# Patient Record
Sex: Male | Born: 1960
Health system: Southern US, Community
[De-identification: ages and names within clinical notes are randomized; demographics above are authoritative.]

## PROBLEM LIST (undated history)

## (undated) ENCOUNTER — Emergency Department (HOSPITAL_COMMUNITY): Payer: BLUE CROSS/BLUE SHIELD

## (undated) DIAGNOSIS — Z6841 Body Mass Index (BMI) 40.0 and over, adult: Secondary | ICD-10-CM

## (undated) DIAGNOSIS — Z8739 Personal history of other diseases of the musculoskeletal system and connective tissue: Secondary | ICD-10-CM

## (undated) DIAGNOSIS — G4733 Obstructive sleep apnea (adult) (pediatric): Secondary | ICD-10-CM

## (undated) DIAGNOSIS — I428 Other cardiomyopathies: Secondary | ICD-10-CM

## (undated) DIAGNOSIS — I10 Essential (primary) hypertension: Secondary | ICD-10-CM

## (undated) DIAGNOSIS — Z7901 Long term (current) use of anticoagulants: Secondary | ICD-10-CM

## (undated) DIAGNOSIS — I4821 Permanent atrial fibrillation: Secondary | ICD-10-CM

## (undated) DIAGNOSIS — I509 Heart failure, unspecified: Secondary | ICD-10-CM

## (undated) DIAGNOSIS — I499 Cardiac arrhythmia, unspecified: Secondary | ICD-10-CM

## (undated) HISTORY — DX: Obstructive sleep apnea (adult) (pediatric): G47.33

## (undated) HISTORY — DX: Morbid (severe) obesity due to excess calories: E66.01

## (undated) HISTORY — DX: Other cardiomyopathies: I42.8

## (undated) HISTORY — DX: Body Mass Index (BMI) 40.0 and over, adult: Z684

## (undated) HISTORY — DX: Long term (current) use of anticoagulants: Z79.01

## (undated) HISTORY — DX: Permanent atrial fibrillation: I48.21

---

## 2003-03-03 ENCOUNTER — Emergency Department (HOSPITAL_COMMUNITY): Admission: EM | Admit: 2003-03-03 | Discharge: 2003-03-03 | Payer: Self-pay | Admitting: Emergency Medicine

## 2003-03-03 ENCOUNTER — Encounter: Payer: Self-pay | Admitting: Emergency Medicine

## 2003-03-28 ENCOUNTER — Encounter: Admission: RE | Admit: 2003-03-28 | Discharge: 2003-06-26 | Payer: Self-pay | Admitting: Internal Medicine

## 2003-10-05 ENCOUNTER — Encounter: Admission: RE | Admit: 2003-10-05 | Discharge: 2004-01-03 | Payer: Self-pay | Admitting: Internal Medicine

## 2007-12-17 HISTORY — PX: CARDIOVERSION: SHX1299

## 2008-01-27 ENCOUNTER — Inpatient Hospital Stay (HOSPITAL_COMMUNITY): Admission: EM | Admit: 2008-01-27 | Discharge: 2008-02-06 | Payer: Self-pay | Admitting: Emergency Medicine

## 2008-01-31 HISTORY — PX: CARDIAC CATHETERIZATION: SHX172

## 2008-04-27 ENCOUNTER — Ambulatory Visit (HOSPITAL_COMMUNITY): Admission: RE | Admit: 2008-04-27 | Discharge: 2008-04-27 | Payer: Self-pay | Admitting: Cardiology

## 2010-12-31 NOTE — Assessment & Plan Note (Signed)
[facility]   NAME:  Travis Bautista, Travis Bautista NO.:  [id number]   MEDICAL RECORD NO.:  [id number]      DATE OF BIRTH:  November 20, 1960   PHYSICIAN:  Ritta Slot, MD           VISIT DATE:                                   OFFICE VISIT   INDICATIONS FOR THE PROCEDURE:  Atrial flutter with active ventricular  response with possible DC cardioversion.   COMPLICATIONS:  None immediate.   SEDATION:  The patient was sedated with fentanyl 75 mcg IV and Versed 5  mg IV and intubated without difficulty.   RESULTS:  1. Left atrial appendage was large in size with decreased ejection      velocity of approximately 20 cm/sec.  No clots or thrombus were      noted.  2. The left atrium was severely dilated with significant smoke.  No      definitive causes were noted.  3. Right atrium was moderate-to-severely dilated.  4. Left ventricle was mildly dilated with severe left ventricular      dysfunction with EF of less than 20% and severe global hypokinesis.      No obvious thrombus was noted.  5. RV was normal in size with severe right ventricular dysfunction.  6. Mitral valve was structurally normal.  7. Tricuspid valve was structurally normal with at least mild      tricuspid regurgitation.  I was unable to obtain adequate Doppler      signal to estimate pulmonary pressures.  8. Aortic valve was structurally normal.  9. Pulmonic valve was normally seen.  10.Interatrial septum was intact by color flow Doppler.  11.Aorta was normal in size without significant atheroma noted.   We will not pursue DC cardioversion due to significant smoke noted in  the left atrium.  I would recommend Coumadin for 3-4 weeks with the  therapeutic INR followed by elective DC cardioversion.   He will need optimization of his medication for his severe left  ventricular dysfunction and probably right and left heart  catheterization at some point as well.     ______________________________  Dani Gobble, MD      Ritta Slot, MD  Electronically Signed    AB/MEDQ  D:  01/28/2008  T:  01/29/2008  Job:  [id number]   cc:   Ritta Slot, MD

## 2010-12-31 NOTE — Cardiovascular Report (Signed)
NAME:  Travis Bautista, Travis Bautista NO.:  [id number]   MEDICAL RECORD NO.:  [id number]         PATIENT TYPE:  COIB   LOCATION:                               FACILITY:  [facility]   PHYSICIAN:  Richard A. Alanda Amass, M.D.DATE OF BIRTH:  15-Aug-1961   DATE OF PROCEDURE:  DATE OF DISCHARGE:                            CARDIAC CATHETERIZATION   DC CARDIOVERSION   PROCEDURE:  Attempted DC cardioversion done at [facility] with IV anesthesia  standby with Dr. Gypsy Balsam.   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   The patient was hospitalized on January 27, 2008, with atrial flutter with  rapid ventricular response that required rate control including IV  amiodarone for rate control and calcium blockers.  Duration of his  atrial fibrillation was unknown.  He had developed heart failure and it  was felt that it was likely he had rate related congestive heart failure  and had been on atrial flutter - fibrillation for some time.   He has severe morbid obesity, sleep apnea, nonsmoker, and his weight was  over 400 pounds when hospitalized in June 2009.   Cardiac catheterization was uncomplicated and showed normal coronary  arteries with EF of 20-25% in AF.   He has been treated medically and kept on Coumadin, followed up as an  outpatient and treated with rate control.  He is classified as  nonischemic cardiomyopathy, but we think that there is a good  possibility that it might be rate related cardiomyopathy due to his  atrial fibrillation although we cannot rule out a primary congestive  nonischemic cardiomyopathy.   TEE-guided cardioversion was considered in the hospital but he did have  some smoke in the left atrium, so that was not performed.  He has been  seen as an outpatient.  His INRs have been therapeutic.  He has done  well on medical  therapy with essentially no symptoms of heart failure,  and he has lost almost 50 pounds since June 2009 with the diet, salt  restriction, and walking program.   CURRENT OUTPATIENT MEDICATIONS:  1. Coreg 12.5 b.i.d.  2. Altace 5.  3. Baby aspirin.  4. Lanoxin 0.25.  5. Aldactone 12.5.  6. Lasix 40 b.i.d.  7. Diltiazem 300.  8. Amiodarone 200 b.i.d.   Since he has been on outpatient amiodarone and had persistent atrial  fibrillation with controlled ventricular response, it was felt that it  was appropriate to bring him in for attempted DC cardioversion.  We had  held off on repeating his echo until cardioversion could be  accomplished.   Current weight is 396 pounds.  He is 6 feet.  We were concerned that  even with bipolar synchronized DC cardioversion that his weight and  chest impedance might inhibit cardioversion.  Procedure risks,  alternatives, and benefits were explained to the patient, he was  agreeable to proceed.  Informed consent was obtained.   INRs were therapeutic.  Renal function normal.  Mild elevation of  nonfasting glucose to 128.  Normal magnesium level.  The patient was in  the postobstructive state.   He was given a total of 600 mg of Pentothal anesthesia by Dr. Gypsy Balsam in  divided doses.   With anterior and posterior paddles and bipolar cautery attempts, a  synchronized cardioversion shock of 120 joules was unsuccessful and 2  shocks of 200 joules were unsuccessful in restoring sinus rhythm.   The pads were then changed to 2 anterior pads and 2 more shocks of 200  joules biphasic were unsuccessful in restoring sinus rhythm.  The  patient remained in atrial fibrillation with a controlled ventricular  response of plus/minus 80 per minute.   We will plan to continue his current medications including Coumadin.  The patient tolerated the procedure well, awoke promptly postanesthesia  with no neurologic deficits.   We will get EP consultation for  consideration of further attempts of  cardioversion and possibly internal cardioversion may be considered  because of his large body mass and impedance, particularly that since he  is on antiarrhythmic therapy.  We will go ahead and obtain an outpatient  2D echo to assess LV function and repeat left atrial size as well.  We  will plan to see the patient back in the office in the next several  weeks or sooner if there are any questions or problems.      Richard A. Alanda Amass, M.D.  Electronically Signed     RAW/MEDQ  D:  04/27/2008  T:  04/28/2008  Job:  [id number]   cc:   Larina Earthly, M.D.

## 2010-12-31 NOTE — Cardiovascular Report (Signed)
NAME:  Travis Bautista, Travis Bautista NO.:  [id number]   MEDICAL RECORD NO.:  [id number]          PATIENT TYPE:  INP   LOCATION:  2905                         FACILITY:  [facility]   PHYSICIAN:  Richard A. Alanda Amass, M.D.DATE OF BIRTH:  [birth year]   DATE OF PROCEDURE:  01/31/2008  DATE OF DISCHARGE:                            CARDIAC CATHETERIZATION   PROCEDURE:  Retrograde central aortic catheterization, retrograde left  heart catheterization, right heart catheterization, thermal dilution  cardiac output, simultaneous A-PO2 difference, selective coronary  angiography via Judkins technique, LV angiogram RAO, LAO projection, and  abdominal aortic angiogram hand injection.   PROCEDURE:  The patient was brought to the second floor CP lab in a  postabsorptive state after premedication of 5-mg Valium p.o.  Heparin  was on hold.  He was in atrial fibrillation-atrial flutter with  ventricular response of 90-100 per minute on IV amiodarone therapy.  The  right groin was prepped, draped in the usual manner.  Xylocaine 1% was  used for local anesthesia.  He has profound morbid obesity with a weight  of 440 pounds.  Fortunately, we were able to enter the CRFA with a  single anterior puncture and a CRFV with a single anterior puncture.  A  6-French using modified Seldinger technique with an 18 thin-wall needle  and a 6-French short sidearm sheath was placed in the CRFA and a 7-  [place] short sidearm sheath then placed in the CRFV.  Right heart  catheterization was done with thermal dilution, flow-directed, triple-  lumen Edward Swan-Ganz catheter .  Coronary angiography was performed  with 6-French 4-cm taper preformed Cordis coronary and pigtail  catheters.  LV angiogram was done in the RAO and LAO projection at 25  mL, 14 mL per second because of poor visualization and because of the  patient's morbid obesity, repeated in the LAO projection of 30 mL, 14 mL  per second.  Pullback pressure with  CA showed no gradient across the  aortic valve.  Catheter was pulled down above the level of the renal  arteries and abdominal aortic angiogram was done by hand injection  demonstrating single normal renal arteries bilaterally.  The pigtail  catheter was re-advanced into the aortic root.  The right heart  catheterization was done with thermodilution, flow-directed Swan-Ganz  catheter.  Simultaneous LV and PCW pressures and pullback PA pressures  were recorded.  Thermal dilution cardiac output was obtained.  Estimated  Fick cardiac output and simultaneous AV O2 difference was performed.  Right heart pullback pressures were performed.  Right heart catheter and  left heart catheters were removed.  Side-arm sheaths were flushed.  The  patient was transferred to the holding area for sheath removal and  pressure hemostasis.  He did tolerate the procedure well and was able to  lie flat throughout the whole procedure without any undue dyspnea.   Pressures:  RA:  31/35; RV mean 31 mmHg.  RV:  65/8; RVEDP 14 mmHg.  PA:  65/33; mean 43 mmHg.  PCW:  35/41; mean 35 mmHg.  LV:  130/8; LVEDP 24 mmHg.  CA:  130/70  mmHg; mean of 105 mmHg.   Thermal dilution CO/CI equal 4.8/1.6 L/MIN/M2.  Estimated Fick, CO/CI equal 5.7/1.9 L/ MIN/M2   PVR 17, T PVR 117, SVR 1300, TSVR equal 1818, DSE to minus fifth.   LV angiogram in the RAO and LAO projection demonstrated severe global  hypokinesis.  There were no definite segmental wall motion abnormalities  and this was difficult to evaluate because the patient was in atrial  fibrillation, EF was approximately 20-25%.  There was no significant  mitral regurgitation seen.   Hand injection of the abdominal aorta revealed single normal renal  arteries bilaterally and normal infrarenal abdominal aorta.   Fluoroscopy did not reveal any coronary, intracardiac, or valvular  calcification.   There was no gradient across the aortic valve on catheter pullback.    There was no gradient between simultaneous recorded LV and PCW wedge  pressures.   CORONARY ANGIOGRAPHY:  Main left coronary artery was short and normal.   The LAD coursed to the apex of the heart where it bifurcated.  Gave off  a large SP1 followed by a bifurcating large DX1 that were normal and  moderate from the proximal third of the LAD.  A normal small DX2 from  the proximal-mid LAD.  The LAD was widely patent and smooth throughout  and normal.   The circumflex was moderate size and nondominant.  A large OM1 and a  large trifurcating OM2 that were normal and a normal bifurcating PM PABG  branch.   The right coronary was a dominant vessel with a normal PDA, PLA, and  normal RV branches.  Widely patent and smooth throughout its course.   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  He has a history of severe exogenous obesity  and hypertension on medical therapy.  He had been often seen recently by  Dr. Felipa Eth and presented to his office with several weeks of symptoms  compatible with heart failure and was found to be in atrial flutter with  rapid ventricular response at about 150 per minute.  He was kindly  referred to [place] and hospitalized.  It was difficult to control his rate,  he ultimately required IV amiodarone for rate control in addition to  beta-blockers with poor rate control with Cardizem alone.  He had  moderate diuresis in the hospital and had symptoms compatible with heart  failure and moderate elevation of BNP with normal renal function.  There  is a history of possible viral illness 1-3 months ago, but this is  somewhat vague.  The patient did not have palpitations when he came to  the hospital and it is likely that he has been in atrial flutter for at  least several weeks or possibly several months or longer.  Presumptive  diagnosis is congestive  heart failure with nonischemic cardiomyopathy.  We are hopeful that this is a cardiomyopathy related to rapid rate since  this would have a better prognosis and outlook than idiopathic-viral  cardiomyopathy.  At present, there is no way to distinguish between  these.   Catheterization was done to assess his coronary arteries, LV, and  pressures.   The patient has significant global hypokinesis related to his AF and low  output and normal coronary arteries.  He has resting pulmonary  hypertension and low cardiac index.  Clinically, he probably also has  sleep apnea.   We  would recommend continued medical therapy predominantly rate control  along with optimizing medical therapy for LV dysfunction.  Once again,  if his CHF is rate related from long-term AF then he will hopefully  improve.  If it is related to cardiomyopathy as primary problem then  recovery is more concerning.  We would plan to.  He has had attempted TE-  guided cardioversion, but had considerable smoke in his atrium, so  cardioversion was not done.  We would recommend continued  anticoagulation with heparin switch over to Coumadin.  We have had to  use amiodarone for rate control.  This does run the risk of him  converting with amiodarone therapy, so we plan not to push this.  The  patient will be followed up as an outpatient.  We will explain all this  to the patient and the family which we have essentially done, but  precatheterization.   CATHETERIZATION DIAGNOSES:  1. Sick sinus syndrome - atrial flutter with rapid ventricular      response.  2. Probable secondary congestive heart failure, rate related, rule out      recent viral or idiopathic cardiomyopathy.  3. Normal renal function.  4. Profound morbid obesity, greater than 400 pounds.  5. Probable sleep apnea, outpatient sleep study will be necessary.  6. Smoker and recent TE evaluation, no cardioversion done.  7. History of systemic hypertension.       Richard A. Alanda Amass, M.D.  Electronically Signed     RAW/MEDQ  D:  01/31/2008  T:  02/01/2008  Job:  [id number]   cc:   Ritta Slot, MD  Richard A. Alanda Amass, M.D.  CP Lab  Larina Earthly, M.D.

## 2010-12-31 NOTE — Discharge Summary (Signed)
NAME:  SHRIYAN, ARAKAWA NO.:  [id number]   MEDICAL RECORD NO.:  [id number]          PATIENT TYPE:  INP   LOCATION:  2014                         FACILITY:  [facility]   PHYSICIAN:  Ritta Slot, MD     DATE OF BIRTH:  Dec 17, 1960   DATE OF ADMISSION:  01/27/2008  DATE OF DISCHARGE:  02/06/2008                               DISCHARGE SUMMARY   DISCHARGE DIAGNOSES:  1. Atrial flutter, controlled ventricular response at discharge.  2. Coumadin therapy, INR 2.1 at discharge.  3. Nonischemic cardiomyopathy by catheterization in this admission      with an ejection fraction of 20%.  4. Morbid obesity.  5. Sleep apnea.   HOSPITAL COURSE:  The patient is a 50 year old male followed by Dr. Felipa Eth  who presents on January 27, 2008, with shortness of breath.  The patient  found to be in 2:1 atrial flutter and sent to [facility] ER by his primary  care doctor.  He was put on IV diltiazem with control of his rate.  He  does have a history of hypertension and was on Benicar prior to  admission.  He was admitted to telemetry and started on IV heparin and  diltiazem.  We did have some problems with hypotension early on during  the admission.  TEE was undertaken on January 28, 2008 by Dr. Domingo Sep.  There was smoke in the LA.  Cardioversion was not done.  Plan is for 3-  4 weeks of therapeutic Coumadin and then elective DC cardioversion.  EF  was of less than 20% with severe global hypokinesis.  The patient  underwent diagnostic catheterization January 31, 2008 by Dr. Alanda Amass for  his cardiomyopathy.  This revealed normal coronaries.  He was put on  Coumadin and heparin.  We had problems again with his rate and some  hypotension.  Dr. Alanda Amass added amiodarone to help with rate control.  We felt he was not a candidate for Lovenox because of his weight.  He  was transferred to telemetry and ambulated.  His INR on February 06, 2008,  is 2.1, and we feel he can be discharged.  He will follow up with  Dr.  Alanda Amass as an outpatient.  He will have a pro time on Tuesday.  At  some point, he will probably need a formal sleep study with setting for  a CPAP.  He was on CPAP in the hospital but  did not tolerate this well.   DISCHARGE MEDICATIONS:  1. Altace 5 mg a day.  2. Coumadin 12.5 mg a day or as directed.  3. Aspirin 81 mg a day.  4. Lanoxin 0.25 mg a day.  5. Aldactone 12.5 mg a day.  6. Prilosec OTC 20 mg a day.  7. Amiodarone 200 mg twice a day.  8. Lasix 40 mg twice a day.  9. Diltiazem CD 300 mg a day.  10.Coreg 12.5 mg twice a day.   LABORATORY DATA:  White count 6.7, hemoglobin 16, hematocrit 48, and  platelets 168.  Sodium 136, potassium 4.0, BUN 15, and creatinine 1.0.  Liver functions  were normal.  Dig level 0.5 on the 16th.  Antithrombin  III is 102, which is within normal limits.  Chest x-ray shows  cardiomegaly and pulmonary vascular congestion.  INR at discharge 2.1.  EKG shows atrial flutter with controlled ventricular response.   DISPOSITION:  The patient is discharged in stable condition.  He will  have a pro time Tuesday.  He will see Dr. Alanda Amass in a couple weeks in  the office.  He will need weekly pro times with plans for a DC  cardioversion when she has been therapeutic for 3-4 weeks in a row.      Abelino Derrick, P.A.      Ritta Slot, MD  Electronically Signed    LKK/MEDQ  D:  02/06/2008  T:  02/06/2008  Job:  [id number]   cc:   Larina Earthly, M.D.

## 2011-04-25 ENCOUNTER — Other Ambulatory Visit: Payer: Self-pay | Admitting: Podiatry

## 2011-04-25 DIAGNOSIS — M25572 Pain in left ankle and joints of left foot: Secondary | ICD-10-CM

## 2011-04-25 DIAGNOSIS — M79672 Pain in left foot: Secondary | ICD-10-CM

## 2011-05-02 ENCOUNTER — Other Ambulatory Visit: Payer: Self-pay

## 2011-05-15 LAB — DIFFERENTIAL
Basophils Absolute: 0
Basophils Absolute: 0
Basophils Relative: 0
Basophils Relative: 0
Basophils Relative: 1
Eosinophils Absolute: 0
Eosinophils Absolute: 0.1
Eosinophils Relative: 0
Eosinophils Relative: 1
Lymphs Abs: 1.1
Monocytes Absolute: 0.7
Monocytes Relative: 10
Neutro Abs: 4.6
Neutro Abs: 5.2
Neutrophils Relative %: 61
Neutrophils Relative %: 79 — ABNORMAL HIGH

## 2011-05-15 LAB — LUPUS ANTICOAGULANT PANEL
DRVVT: 48.7 — ABNORMAL HIGH (ref 36.1–47.0)
PTT Lupus Anticoagulant: 30.2 — ABNORMAL LOW (ref 36.3–48.8)
dRVVT Incubated 1:1 Mix: 39.7 (ref 36.1–47.0)

## 2011-05-15 LAB — PROTIME-INR
INR: 1.1
INR: 1.1
INR: 1
INR: 1.1
INR: 1.5
INR: 1.5
Prothrombin Time: 14.1
Prothrombin Time: 13.3
Prothrombin Time: 13.6
Prothrombin Time: 18.5 — ABNORMAL HIGH
Prothrombin Time: 23.9 — ABNORMAL HIGH

## 2011-05-15 LAB — BASIC METABOLIC PANEL
BUN: 15
BUN: 12
BUN: 12
BUN: 13
CO2: 25
CO2: 27
CO2: 30
CO2: 31
Calcium: 8.9
Calcium: 8.9
Calcium: 9
Calcium: 9.2
Chloride: 106
Chloride: 99
Chloride: 100
Chloride: 97
Creatinine, Ser: 1.26
Creatinine, Ser: 1.01
Creatinine, Ser: 1.06
Creatinine, Ser: 1.07
Creatinine, Ser: 1.07
GFR calc Af Amer: >60
GFR calc Af Amer: >60
GFR calc Af Amer: >60
GFR calc non Af Amer: >60
GFR calc non Af Amer: >60
Glucose, Bld: 102 — ABNORMAL HIGH
Glucose, Bld: 109 — ABNORMAL HIGH
Glucose, Bld: 111 — ABNORMAL HIGH
Glucose, Bld: 102 — ABNORMAL HIGH
Glucose, Bld: 107 — ABNORMAL HIGH
Glucose, Bld: 107 — ABNORMAL HIGH
Potassium: 3.8
Potassium: 4.3
Potassium: 3.9
Potassium: 4.1
Sodium: 141
Sodium: 136

## 2011-05-15 LAB — CBC
HCT: 41.6
HCT: 41.8
HCT: 41.9
HCT: 43.5
Hemoglobin: 14.4
Hemoglobin: 14.4
Hemoglobin: 15.2
Hemoglobin: 14.4
Hemoglobin: 14.6
Hemoglobin: 15.5
Hemoglobin: 16
MCHC: 33.4
MCHC: 34
MCHC: 34.5
MCHC: 32.9
MCHC: 33.6
MCHC: 34.2
MCV: 92.6
MCV: 92.7
MCV: 93.2
MCV: 92.4
MCV: 92.9
MCV: 92.9
Platelets: 169
Platelets: 136 — ABNORMAL LOW
Platelets: 150
Platelets: 152
Platelets: 160
RBC: 4.48
RBC: 4.62
RBC: 5.21
RDW: 14.5
RDW: 14.6
RDW: 14.6
RDW: 13.7
RDW: 13.8
RDW: 13.9
RDW: 14.1
RDW: 14.5
WBC: 7.1
WBC: 7.7
WBC: 6.7
WBC: 7.1

## 2011-05-15 LAB — APTT
aPTT: 25
aPTT: 61 — ABNORMAL HIGH

## 2011-05-15 LAB — HEPARIN LEVEL (UNFRACTIONATED)
Heparin Unfractionated: 0.57
Heparin Unfractionated: 0.77 — ABNORMAL HIGH
Heparin Unfractionated: 0.55
Heparin Unfractionated: 0.56
Heparin Unfractionated: 0.58
Heparin Unfractionated: 0.6
Heparin Unfractionated: 0.63

## 2011-05-15 LAB — POCT CARDIAC MARKERS
CKMB, poc: 1.2
Myoglobin, poc: 59.9
Operator id: 272551
Troponin i, poc: <0.05

## 2011-05-15 LAB — PROTEIN S, TOTAL: Protein S Ag, Total: 101 % (ref 70–140)

## 2011-05-15 LAB — POCT I-STAT 3, ART BLOOD GAS (G3+)
Acid-Base Excess: 1
O2 Saturation: 89
Operator id: 118911
pO2, Arterial: 58 — ABNORMAL LOW

## 2011-05-15 LAB — COMPREHENSIVE METABOLIC PANEL
ALT: 25
AST: 24
Albumin: 3.7
Alkaline Phosphatase: 46
BUN: 16
Calcium: 9
Chloride: 104
Creatinine, Ser: 1.04
Glucose, Bld: 106 — ABNORMAL HIGH
Potassium: 4.2
Sodium: 138
Total Bilirubin: 0.7
Total Protein: 6.3
Total Protein: 6.1

## 2011-05-15 LAB — LIPID PANEL
HDL: 22 — ABNORMAL LOW
Triglycerides: 132

## 2011-05-15 LAB — CK TOTAL AND CKMB (NOT AT ARMC)
CK, MB: 2.7
Relative Index: INVALID
Total CK: 42

## 2011-05-15 LAB — URINALYSIS, ROUTINE W REFLEX MICROSCOPIC
Bilirubin Urine: NEGATIVE
Hgb urine dipstick: NEGATIVE
Nitrite: NEGATIVE
Protein, ur: NEGATIVE
Urobilinogen, UA: 1

## 2011-05-15 LAB — DIGOXIN LEVEL: Digoxin Level: 0.5 — ABNORMAL LOW

## 2011-05-15 LAB — CARDIAC PANEL(CRET KIN+CKTOT+MB+TROPI): Relative Index: INVALID

## 2011-05-15 LAB — BETA-2-GLYCOPROTEIN I ABS, IGG/M/A
Beta-2 Glyco I IgG: 4 U/mL (ref <20)
Beta-2-Glycoprotein I IgM: <4 U/mL (ref <10)

## 2011-05-15 LAB — CARDIOLIPIN ANTIBODIES, IGG, IGM, IGA: Anticardiolipin IgG: <7 — ABNORMAL LOW (ref <11)

## 2011-05-15 LAB — POCT I-STAT 3, VENOUS BLOOD GAS (G3P V)
Acid-Base Excess: 4 — ABNORMAL HIGH
Bicarbonate: 29.3 — ABNORMAL HIGH
O2 Saturation: 54
TCO2: 31
pO2, Ven: 29 — CL

## 2011-05-15 LAB — HEMOGLOBIN A1C: Hgb A1c MFr Bld: 5.9

## 2011-05-15 LAB — PROTHROMBIN GENE MUTATION

## 2011-05-15 LAB — B-NATRIURETIC PEPTIDE (CONVERTED LAB): Pro B Natriuretic peptide (BNP): 131 — ABNORMAL HIGH

## 2011-05-15 LAB — FACTOR 5 LEIDEN

## 2011-05-15 LAB — PROTEIN C, TOTAL: Protein C, Total: 53 % — ABNORMAL LOW (ref 70–140)

## 2011-05-15 LAB — ANTITHROMBIN III: AntiThromb III Func: 102 (ref 76–126)

## 2011-05-15 LAB — T4: T4, Total: 8.9

## 2011-05-15 LAB — MAGNESIUM: Magnesium: 1.7

## 2011-05-21 LAB — PROTIME-INR: Prothrombin Time: 28.9 — ABNORMAL HIGH

## 2012-10-15 ENCOUNTER — Encounter: Payer: Self-pay | Admitting: Cardiology

## 2013-02-10 ENCOUNTER — Telehealth: Payer: Self-pay | Admitting: Cardiovascular Disease

## 2013-02-10 NOTE — Telephone Encounter (Signed)
Penni Bombard, PA-C notified and advised regular yoga, but not bikram yoga.    Returned call and informed pt per instructions by MD/PA.  Pt verbalized understanding and agreed w/ plan.

## 2013-02-10 NOTE — Telephone Encounter (Signed)
Please call question about exercising!

## 2013-02-10 NOTE — Telephone Encounter (Signed)
Returned call.  Pt stated he is preparing for bariatric surgery and has lost 53 lbs since he saw Dr. Alanda Amass.  Pt wanted to know if he can do Bikram Yoga, which is where they warm the room during Yoga.  Stated he wanted to make sure it won't affect his Afib.  Pt informed Dr. Alanda Amass is out of the office x 2 weeks and a provider will be notified for more information.  Pt verbalized understanding and agreed w/ plan.

## 2013-05-16 ENCOUNTER — Telehealth: Payer: Self-pay | Admitting: Cardiovascular Disease

## 2013-05-16 MED ORDER — WARFARIN SODIUM 5 MG PO TABS: ORAL_TABLET | ORAL | Status: DC

## 2013-05-16 NOTE — Telephone Encounter (Signed)
Message forwarded to K. Alvstad, PharmD.  

## 2013-05-16 NOTE — Telephone Encounter (Signed)
Generic Coumadin ---please refill  [facility] on [address]---this has been faxed x 3

## 2013-05-31 ENCOUNTER — Ambulatory Visit: Payer: Self-pay | Admitting: Pharmacist Clinician (PhC)/ Clinical Pharmacy Specialist

## 2013-06-03 ENCOUNTER — Ambulatory Visit: Payer: Self-pay | Admitting: Pharmacist Clinician (PhC)/ Clinical Pharmacy Specialist

## 2013-06-03 ENCOUNTER — Ambulatory Visit (INDEPENDENT_AMBULATORY_CARE_PROVIDER_SITE_OTHER): Payer: BC Managed Care – PPO | Admitting: Pharmacist Clinician (PhC)/ Clinical Pharmacy Specialist

## 2013-06-03 VITALS — BP 148/80 | HR 72

## 2013-06-03 DIAGNOSIS — I4891 Unspecified atrial fibrillation: Secondary | ICD-10-CM

## 2013-06-06 ENCOUNTER — Other Ambulatory Visit: Payer: Self-pay | Admitting: *Deleted

## 2013-06-06 MED ORDER — WARFARIN SODIUM 5 MG PO TABS: ORAL_TABLET | ORAL | Status: DC

## 2013-07-12 ENCOUNTER — Ambulatory Visit: Payer: BC Managed Care – PPO | Admitting: Pharmacist Clinician (PhC)/ Clinical Pharmacy Specialist

## 2013-07-20 ENCOUNTER — Other Ambulatory Visit: Payer: Self-pay | Admitting: *Deleted

## 2013-07-20 MED ORDER — DIGOXIN 250 MCG PO TABS: 0.2500 mg | ORAL_TABLET | Freq: Every day | ORAL | Status: DC

## 2013-07-22 ENCOUNTER — Telehealth: Payer: Self-pay | Admitting: Cardiovascular Disease

## 2013-07-22 MED ORDER — SPIRONOLACTONE 25 MG PO TABS: 12.5000 mg | ORAL_TABLET | Freq: Every day | ORAL | Status: DC

## 2013-07-22 MED ORDER — CARVEDILOL 12.5 MG PO TABS: 12.5000 mg | ORAL_TABLET | Freq: Two times a day (BID) | ORAL | Status: DC

## 2013-07-22 NOTE — Telephone Encounter (Signed)
Rx was sent to pharmacy electronically. 

## 2013-07-22 NOTE — Telephone Encounter (Signed)
Need refill on Alvactone 25 mg #15 and Coreg 12.5 mg #60.

## 2013-07-22 NOTE — Telephone Encounter (Signed)
Called pharmacy to fax paper request.

## 2013-08-02 ENCOUNTER — Other Ambulatory Visit: Payer: Self-pay

## 2013-08-02 MED ORDER — AMIODARONE HCL 200 MG PO TABS: 200.0000 mg | ORAL_TABLET | Freq: Every day | ORAL | Status: DC

## 2013-08-02 NOTE — Telephone Encounter (Signed)
Rx was sent to pharmacy electronically. 

## 2013-08-16 ENCOUNTER — Telehealth: Payer: Self-pay | Admitting: Cardiovascular Disease

## 2013-08-16 ENCOUNTER — Other Ambulatory Visit: Payer: Self-pay | Admitting: *Deleted

## 2013-08-16 MED ORDER — FUROSEMIDE 40 MG PO TABS: ORAL_TABLET | ORAL | Status: DC

## 2013-08-16 NOTE — Telephone Encounter (Signed)
Returned call.  Left message asking that hardy copy request be faxed to [phone] for refills.  

## 2013-08-16 NOTE — Telephone Encounter (Signed)
Rx was sent to pharmacy electronically. 

## 2013-08-16 NOTE — Telephone Encounter (Signed)
Need refill on Furosemide 40 mg #60

## 2013-08-24 ENCOUNTER — Other Ambulatory Visit: Payer: Self-pay | Admitting: Cardiovascular Disease

## 2013-08-26 ENCOUNTER — Other Ambulatory Visit: Payer: Self-pay | Admitting: Cardiovascular Disease

## 2013-08-26 NOTE — Telephone Encounter (Signed)
Rx was sent to pharmacy electronically. 

## 2013-08-29 ENCOUNTER — Other Ambulatory Visit: Payer: Self-pay | Admitting: *Deleted

## 2013-08-29 MED ORDER — RAMIPRIL 5 MG PO CAPS: 5.0000 mg | ORAL_CAPSULE | Freq: Every day | ORAL | Status: DC

## 2013-08-29 NOTE — Telephone Encounter (Signed)
Rx was sent to pharmacy electronically. 

## 2013-09-02 ENCOUNTER — Other Ambulatory Visit: Payer: Self-pay | Admitting: Cardiovascular Disease

## 2013-09-02 NOTE — Telephone Encounter (Signed)
Rx was sent to pharmacy electronically. 

## 2013-09-15 ENCOUNTER — Other Ambulatory Visit: Payer: Self-pay | Admitting: Cardiovascular Disease

## 2013-09-18 ENCOUNTER — Other Ambulatory Visit: Payer: Self-pay | Admitting: Cardiovascular Disease

## 2013-09-20 ENCOUNTER — Other Ambulatory Visit: Payer: Self-pay | Admitting: *Deleted

## 2013-09-20 ENCOUNTER — Ambulatory Visit (INDEPENDENT_AMBULATORY_CARE_PROVIDER_SITE_OTHER): Payer: BC Managed Care – PPO | Admitting: Internal Medicine

## 2013-09-20 ENCOUNTER — Ambulatory Visit (INDEPENDENT_AMBULATORY_CARE_PROVIDER_SITE_OTHER): Payer: BC Managed Care – PPO | Admitting: Pharmacist Clinician (PhC)/ Clinical Pharmacy Specialist

## 2013-09-20 ENCOUNTER — Encounter: Payer: Self-pay | Admitting: Internal Medicine

## 2013-09-20 VITALS — BP 140/80 | HR 87 | Ht 71.5 in | Wt >= 6400 oz

## 2013-09-20 DIAGNOSIS — R600 Localized edema: Secondary | ICD-10-CM

## 2013-09-20 DIAGNOSIS — I4891 Unspecified atrial fibrillation: Secondary | ICD-10-CM

## 2013-09-20 DIAGNOSIS — I428 Other cardiomyopathies: Secondary | ICD-10-CM

## 2013-09-20 DIAGNOSIS — G4733 Obstructive sleep apnea (adult) (pediatric): Secondary | ICD-10-CM

## 2013-09-20 DIAGNOSIS — I10 Essential (primary) hypertension: Secondary | ICD-10-CM

## 2013-09-20 DIAGNOSIS — R0989 Other specified symptoms and signs involving the circulatory and respiratory systems: Secondary | ICD-10-CM

## 2013-09-20 DIAGNOSIS — I4819 Other persistent atrial fibrillation: Secondary | ICD-10-CM

## 2013-09-20 DIAGNOSIS — R0609 Other forms of dyspnea: Secondary | ICD-10-CM

## 2013-09-20 DIAGNOSIS — R609 Edema, unspecified: Secondary | ICD-10-CM

## 2013-09-20 DIAGNOSIS — I509 Heart failure, unspecified: Secondary | ICD-10-CM

## 2013-09-20 DIAGNOSIS — Z7901 Long term (current) use of anticoagulants: Secondary | ICD-10-CM

## 2013-09-20 DIAGNOSIS — I429 Cardiomyopathy, unspecified: Secondary | ICD-10-CM

## 2013-09-20 DIAGNOSIS — I519 Heart disease, unspecified: Secondary | ICD-10-CM

## 2013-09-20 LAB — POCT INR: INR: 2.1
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
## 2013-09-20 NOTE — Patient Instructions (Signed)
Your physician has recommended that you have a sleep study. This test records several body functions during sleep, including: brain activity, eye movement, oxygen and carbon dioxide blood levels, heart rate and rhythm, breathing rate and rhythm, the flow of air through your mouth and nose, snoring, body muscle movements, and chest and belly movement.  Your physician has requested that you have an echocardiogram. Echocardiography is a painless test that uses sound waves to create images of your heart. It provides your doctor with information about the size and shape of your heart and how well your heart's chambers and valves are working. This procedure takes approximately one hour. There are no restrictions for this procedure.  Your physician has recommended you make the following change in your medication: STOP aspirin. STOP amiodarone.   Your physician wants you to follow-up in: 3 months with Dr. Rennis Golden. You will receive a reminder letter in the mail two months in advance. If you don't receive a letter, please call our office to schedule the follow-up appointment.  Please try to schedule your echo for the same day as your next coumadin check with Belenda Cruise.

## 2013-09-20 NOTE — Telephone Encounter (Signed)
Rx was sent to pharmacy electronically. 

## 2013-10-03 ENCOUNTER — Ambulatory Visit (HOSPITAL_COMMUNITY): Payer: BC Managed Care – PPO

## 2013-10-03 ENCOUNTER — Ambulatory Visit: Payer: BC Managed Care – PPO | Admitting: Pharmacist Clinician (PhC)/ Clinical Pharmacy Specialist

## 2013-10-12 ENCOUNTER — Ambulatory Visit (HOSPITAL_COMMUNITY): Payer: BC Managed Care – PPO

## 2013-10-18 ENCOUNTER — Telehealth (HOSPITAL_COMMUNITY): Payer: Self-pay | Admitting: *Deleted

## 2013-10-18 ENCOUNTER — Telehealth: Payer: Self-pay | Admitting: Internal Medicine

## 2013-10-18 NOTE — Telephone Encounter (Signed)
Pt wanted you to know pt

## 2013-10-26 ENCOUNTER — Other Ambulatory Visit: Payer: Self-pay | Admitting: Cardiovascular Disease

## 2013-10-26 NOTE — Telephone Encounter (Signed)
Rx was sent to pharmacy electronically. 

## 2013-10-27 ENCOUNTER — Ambulatory Visit (HOSPITAL_COMMUNITY): Payer: BC Managed Care – PPO

## 2013-11-06 ENCOUNTER — Other Ambulatory Visit: Payer: Self-pay | Admitting: Internal Medicine

## 2013-11-07 ENCOUNTER — Ambulatory Visit: Payer: BC Managed Care – PPO | Admitting: Pharmacist Clinician (PhC)/ Clinical Pharmacy Specialist

## 2013-11-07 ENCOUNTER — Ambulatory Visit (HOSPITAL_COMMUNITY): Payer: BC Managed Care – PPO

## 2013-11-07 NOTE — Telephone Encounter (Signed)
Rx was sent to pharmacy electronically. 

## 2013-11-08 ENCOUNTER — Other Ambulatory Visit: Payer: Self-pay | Admitting: Internal Medicine

## 2013-11-08 NOTE — Telephone Encounter (Signed)
Rx was sent to pharmacy electronically. 

## 2013-11-14 ENCOUNTER — Ambulatory Visit: Payer: BC Managed Care – PPO | Admitting: Pharmacist Clinician (PhC)/ Clinical Pharmacy Specialist

## 2013-11-14 ENCOUNTER — Ambulatory Visit (HOSPITAL_COMMUNITY): Payer: BC Managed Care – PPO

## 2013-11-16 ENCOUNTER — Inpatient Hospital Stay (HOSPITAL_COMMUNITY): Admission: RE | Admit: 2013-11-16 | Payer: BC Managed Care – PPO | Source: Ambulatory Visit

## 2013-11-16 ENCOUNTER — Ambulatory Visit: Payer: BC Managed Care – PPO | Admitting: Pharmacist Clinician (PhC)/ Clinical Pharmacy Specialist

## 2013-11-18 ENCOUNTER — Other Ambulatory Visit: Payer: Self-pay | Admitting: *Deleted

## 2013-11-18 MED ORDER — DILTIAZEM HCL ER BEADS 240 MG PO CP24: 240.0000 mg | ORAL_CAPSULE | Freq: Every day | ORAL | Status: DC

## 2013-11-18 NOTE — Telephone Encounter (Signed)
Rx refill sent to patients pharmacy  

## 2013-12-23 ENCOUNTER — Other Ambulatory Visit: Payer: Self-pay | Admitting: Cardiovascular Disease

## 2013-12-26 ENCOUNTER — Ambulatory Visit (INDEPENDENT_AMBULATORY_CARE_PROVIDER_SITE_OTHER): Payer: BC Managed Care – PPO | Admitting: Pharmacist Clinician (PhC)/ Clinical Pharmacy Specialist

## 2013-12-26 ENCOUNTER — Ambulatory Visit: Payer: BC Managed Care – PPO | Admitting: Pharmacist Clinician (PhC)/ Clinical Pharmacy Specialist

## 2013-12-26 DIAGNOSIS — I4891 Unspecified atrial fibrillation: Secondary | ICD-10-CM

## 2013-12-26 LAB — POCT INR: INR: 1.6

## 2014-01-02 ENCOUNTER — Ambulatory Visit (HOSPITAL_COMMUNITY)
Admission: RE | Admit: 2014-01-02 | Discharge: 2014-01-02 | Disposition: A | Discharge status: Home / Self Care | Payer: BC Managed Care – PPO | Source: Ambulatory Visit | Attending: Internal Medicine | Admitting: Internal Medicine

## 2014-01-02 ENCOUNTER — Ambulatory Visit: Payer: BC Managed Care – PPO

## 2014-01-02 DIAGNOSIS — I509 Heart failure, unspecified: Secondary | ICD-10-CM

## 2014-01-02 DIAGNOSIS — I369 Nonrheumatic tricuspid valve disorder, unspecified: Secondary | ICD-10-CM

## 2014-01-02 DIAGNOSIS — I4891 Unspecified atrial fibrillation: Secondary | ICD-10-CM

## 2014-01-02 DIAGNOSIS — I429 Cardiomyopathy, unspecified: Secondary | ICD-10-CM

## 2014-01-02 DIAGNOSIS — I519 Heart disease, unspecified: Secondary | ICD-10-CM

## 2014-01-02 DIAGNOSIS — I428 Other cardiomyopathies: Secondary | ICD-10-CM | POA: Insufficient documentation

## 2014-01-02 NOTE — Progress Notes (Signed)
2D Echo Performed 01/02/2014    Kaelen Caughlin, RCS  

## 2014-01-03 ENCOUNTER — Ambulatory Visit (INDEPENDENT_AMBULATORY_CARE_PROVIDER_SITE_OTHER): Payer: BC Managed Care – PPO | Admitting: *Deleted

## 2014-01-03 DIAGNOSIS — I4891 Unspecified atrial fibrillation: Secondary | ICD-10-CM

## 2014-01-03 LAB — POCT INR: INR: 3.2

## 2014-01-20 ENCOUNTER — Other Ambulatory Visit: Payer: Self-pay | Admitting: Pharmacist Clinician (PhC)/ Clinical Pharmacy Specialist

## 2014-02-02 ENCOUNTER — Other Ambulatory Visit: Payer: Self-pay | Admitting: Cardiovascular Disease

## 2014-04-16 ENCOUNTER — Other Ambulatory Visit: Payer: Self-pay | Admitting: Pharmacist Clinician (PhC)/ Clinical Pharmacy Specialist

## 2014-06-08 ENCOUNTER — Other Ambulatory Visit: Payer: Self-pay | Admitting: Pharmacist Clinician (PhC)/ Clinical Pharmacy Specialist

## 2014-06-08 MED ORDER — WARFARIN SODIUM 5 MG PO TABS: ORAL_TABLET | ORAL | Status: DC

## 2014-07-06 ENCOUNTER — Other Ambulatory Visit: Payer: Self-pay | Admitting: *Deleted

## 2014-07-06 MED ORDER — WARFARIN SODIUM 5 MG PO TABS: ORAL_TABLET | ORAL | Status: DC

## 2014-07-17 ENCOUNTER — Ambulatory Visit: Payer: BC Managed Care – PPO | Admitting: Pharmacist Clinician (PhC)/ Clinical Pharmacy Specialist

## 2014-07-19 ENCOUNTER — Ambulatory Visit (INDEPENDENT_AMBULATORY_CARE_PROVIDER_SITE_OTHER): Payer: BC Managed Care – PPO | Admitting: Pharmacist Clinician (PhC)/ Clinical Pharmacy Specialist

## 2014-07-19 DIAGNOSIS — I4891 Unspecified atrial fibrillation: Secondary | ICD-10-CM

## 2014-07-19 LAB — POCT INR: INR: 1.4

## 2014-07-21 ENCOUNTER — Other Ambulatory Visit: Payer: Self-pay | Admitting: Pharmacist Clinician (PhC)/ Clinical Pharmacy Specialist

## 2014-07-26 ENCOUNTER — Ambulatory Visit: Payer: BC Managed Care – PPO | Admitting: Pharmacist Clinician (PhC)/ Clinical Pharmacy Specialist

## 2014-07-31 ENCOUNTER — Ambulatory Visit: Payer: BC Managed Care – PPO | Admitting: Pharmacist Clinician (PhC)/ Clinical Pharmacy Specialist

## 2014-08-19 ENCOUNTER — Other Ambulatory Visit: Payer: Self-pay | Admitting: Internal Medicine

## 2014-08-21 NOTE — Telephone Encounter (Signed)
Rx refill sent to patient pharmacy   

## 2014-09-22 ENCOUNTER — Other Ambulatory Visit: Payer: Self-pay | Admitting: Internal Medicine

## 2014-09-22 NOTE — Telephone Encounter (Signed)
Rx(s) sent to pharmacy electronically.  

## 2014-10-03 ENCOUNTER — Other Ambulatory Visit: Payer: Self-pay | Admitting: Pharmacist Clinician (PhC)/ Clinical Pharmacy Specialist

## 2014-10-03 ENCOUNTER — Other Ambulatory Visit: Payer: Self-pay | Admitting: Internal Medicine

## 2014-10-03 NOTE — Telephone Encounter (Signed)
Rx(s) sent to pharmacy electronically.  

## 2014-10-18 ENCOUNTER — Other Ambulatory Visit: Payer: Self-pay | Admitting: Internal Medicine

## 2014-10-18 NOTE — Telephone Encounter (Signed)
Rx has been sent to the pharmacy electronically. ° °

## 2014-10-20 ENCOUNTER — Ambulatory Visit: Payer: Self-pay | Admitting: Pharmacist Clinician (PhC)/ Clinical Pharmacy Specialist

## 2014-10-22 ENCOUNTER — Inpatient Hospital Stay (HOSPITAL_COMMUNITY)
Admission: EM | Admit: 2014-10-22 | Discharge: 2014-10-27 | DRG: 554 | Disposition: A | Discharge status: Home / Self Care | Payer: BLUE CROSS/BLUE SHIELD | Attending: Internal Medicine | Admitting: Internal Medicine

## 2014-10-22 ENCOUNTER — Other Ambulatory Visit: Payer: Self-pay | Admitting: Internal Medicine

## 2014-10-22 ENCOUNTER — Encounter (HOSPITAL_COMMUNITY): Payer: Self-pay | Admitting: Emergency Medicine

## 2014-10-22 DIAGNOSIS — I4819 Other persistent atrial fibrillation: Secondary | ICD-10-CM | POA: Diagnosis present

## 2014-10-22 DIAGNOSIS — M109 Gout, unspecified: Principal | ICD-10-CM | POA: Diagnosis present

## 2014-10-22 DIAGNOSIS — Z87891 Personal history of nicotine dependence: Secondary | ICD-10-CM

## 2014-10-22 DIAGNOSIS — N179 Acute kidney failure, unspecified: Secondary | ICD-10-CM | POA: Diagnosis present

## 2014-10-22 DIAGNOSIS — Z6841 Body Mass Index (BMI) 40.0 and over, adult: Secondary | ICD-10-CM

## 2014-10-22 DIAGNOSIS — I428 Other cardiomyopathies: Secondary | ICD-10-CM

## 2014-10-22 DIAGNOSIS — D72829 Elevated white blood cell count, unspecified: Secondary | ICD-10-CM | POA: Diagnosis present

## 2014-10-22 DIAGNOSIS — Z7901 Long term (current) use of anticoagulants: Secondary | ICD-10-CM

## 2014-10-22 DIAGNOSIS — I481 Persistent atrial fibrillation: Secondary | ICD-10-CM | POA: Diagnosis present

## 2014-10-22 DIAGNOSIS — I255 Ischemic cardiomyopathy: Secondary | ICD-10-CM | POA: Diagnosis present

## 2014-10-22 DIAGNOSIS — G4733 Obstructive sleep apnea (adult) (pediatric): Secondary | ICD-10-CM | POA: Diagnosis present

## 2014-10-22 DIAGNOSIS — M25572 Pain in left ankle and joints of left foot: Secondary | ICD-10-CM | POA: Diagnosis not present

## 2014-10-22 DIAGNOSIS — E871 Hypo-osmolality and hyponatremia: Secondary | ICD-10-CM | POA: Diagnosis present

## 2014-10-22 DIAGNOSIS — L732 Hidradenitis suppurativa: Secondary | ICD-10-CM | POA: Diagnosis present

## 2014-10-22 DIAGNOSIS — I10 Essential (primary) hypertension: Secondary | ICD-10-CM | POA: Diagnosis present

## 2014-10-22 DIAGNOSIS — E860 Dehydration: Secondary | ICD-10-CM | POA: Diagnosis present

## 2014-10-22 DIAGNOSIS — M25571 Pain in right ankle and joints of right foot: Secondary | ICD-10-CM | POA: Diagnosis present

## 2014-10-22 DIAGNOSIS — M25579 Pain in unspecified ankle and joints of unspecified foot: Secondary | ICD-10-CM

## 2014-10-22 DIAGNOSIS — I482 Chronic atrial fibrillation: Secondary | ICD-10-CM | POA: Diagnosis present

## 2014-10-22 DIAGNOSIS — W19XXXA Unspecified fall, initial encounter: Secondary | ICD-10-CM

## 2014-10-22 DIAGNOSIS — R600 Localized edema: Secondary | ICD-10-CM

## 2014-10-22 DIAGNOSIS — R0789 Other chest pain: Secondary | ICD-10-CM

## 2014-10-22 HISTORY — DX: Essential (primary) hypertension: I10

## 2014-10-22 NOTE — ED Notes (Signed)
Bed: WTR7 Expected date:  Expected time:  Means of arrival:  Comments: EMS 39M ankle pain
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
## 2014-10-23 ENCOUNTER — Ambulatory Visit: Payer: Self-pay | Admitting: Pharmacist Clinician (PhC)/ Clinical Pharmacy Specialist

## 2014-10-23 ENCOUNTER — Emergency Department (HOSPITAL_COMMUNITY): Payer: BLUE CROSS/BLUE SHIELD

## 2014-10-23 ENCOUNTER — Encounter (HOSPITAL_COMMUNITY): Payer: Self-pay | Admitting: Internal Medicine

## 2014-10-23 DIAGNOSIS — M25572 Pain in left ankle and joints of left foot: Secondary | ICD-10-CM

## 2014-10-23 DIAGNOSIS — I10 Essential (primary) hypertension: Secondary | ICD-10-CM

## 2014-10-23 DIAGNOSIS — I481 Persistent atrial fibrillation: Secondary | ICD-10-CM

## 2014-10-23 DIAGNOSIS — I429 Cardiomyopathy, unspecified: Secondary | ICD-10-CM

## 2014-10-23 DIAGNOSIS — M25571 Pain in right ankle and joints of right foot: Secondary | ICD-10-CM

## 2014-10-23 LAB — CBC WITH DIFFERENTIAL/PLATELET
BASOS PCT: 0 % (ref 0–1)
Basophils Absolute: 0 10*3/uL (ref 0.0–0.1)
Basophils Absolute: 0 10*3/uL (ref 0.0–0.1)
Basophils Relative: 0 % (ref 0–1)
EOS ABS: 0.1 10*3/uL (ref 0.0–0.7)
Eosinophils Absolute: 0.1 10*3/uL (ref 0.0–0.7)
Eosinophils Relative: 0 % (ref 0–5)
Eosinophils Relative: 1 % (ref 0–5)
HCT: 47.9 % (ref 39.0–52.0)
HEMATOCRIT: 47.7 % (ref 39.0–52.0)
Hemoglobin: 16 g/dL (ref 13.0–17.0)
Hemoglobin: 15.6 g/dL (ref 13.0–17.0)
LYMPHS ABS: 1.3 10*3/uL (ref 0.7–4.0)
Lymphocytes Relative: 9 % — ABNORMAL LOW (ref 12–46)
Lymphocytes Relative: 11 % — ABNORMAL LOW (ref 12–46)
Lymphs Abs: 1.2 10*3/uL (ref 0.7–4.0)
MCH: 32.7 pg (ref 26.0–34.0)
MCH: 32.2 pg (ref 26.0–34.0)
MCHC: 33.4 g/dL (ref 30.0–36.0)
MCHC: 32.7 g/dL (ref 30.0–36.0)
MCV: 98 fL (ref 78.0–100.0)
MCV: 98.6 fL (ref 78.0–100.0)
MONO ABS: 1.1 10*3/uL — AB (ref 0.1–1.0)
MONOS PCT: 10 % (ref 3–12)
MONOS PCT: 7 % (ref 3–12)
Monocytes Absolute: 0.9 10*3/uL (ref 0.1–1.0)
NEUTROS ABS: 11.7 10*3/uL — AB (ref 1.7–7.7)
NEUTROS ABS: 9 10*3/uL — AB (ref 1.7–7.7)
NEUTROS PCT: 84 % — AB (ref 43–77)
Neutrophils Relative %: 78 % — ABNORMAL HIGH (ref 43–77)
Platelets: 315 10*3/uL (ref 150–400)
Platelets: 314 10*3/uL (ref 150–400)
RBC: 4.89 MIL/uL (ref 4.22–5.81)
RBC: 4.84 MIL/uL (ref 4.22–5.81)
RDW: 13.6 % (ref 11.5–15.5)
RDW: 13.8 % (ref 11.5–15.5)
WBC: 14 10*3/uL — ABNORMAL HIGH (ref 4.0–10.5)
WBC: 11.4 10*3/uL — ABNORMAL HIGH (ref 4.0–10.5)

## 2014-10-23 LAB — BASIC METABOLIC PANEL
Anion gap: 14 (ref 5–15)
BUN: 23 mg/dL (ref 6–23)
CHLORIDE: 89 mmol/L — AB (ref 96–112)
CO2: 31 mmol/L (ref 19–32)
Calcium: 9.3 mg/dL (ref 8.4–10.5)
Creatinine, Ser: 1.16 mg/dL (ref 0.50–1.35)
GFR calc Af Amer: 83 mL/min — ABNORMAL LOW (ref >90)
GFR, EST NON AFRICAN AMERICAN: 71 mL/min — AB (ref >90)
Glucose, Bld: 93 mg/dL (ref 70–99)
Potassium: 4.2 mmol/L (ref 3.5–5.1)
SODIUM: 134 mmol/L — AB (ref 135–145)

## 2014-10-23 LAB — COMPREHENSIVE METABOLIC PANEL
ALT: 22 U/L (ref 0–53)
AST: 30 U/L (ref 0–37)
Albumin: 3.8 g/dL (ref 3.5–5.2)
Alkaline Phosphatase: 53 U/L (ref 39–117)
Anion gap: 11 (ref 5–15)
BUN: 21 mg/dL (ref 6–23)
CO2: 30 mmol/L (ref 19–32)
CREATININE: 1.18 mg/dL (ref 0.50–1.35)
Calcium: 9.4 mg/dL (ref 8.4–10.5)
Chloride: 93 mmol/L — ABNORMAL LOW (ref 96–112)
GFR calc non Af Amer: 70 mL/min — ABNORMAL LOW (ref >90)
GFR, EST AFRICAN AMERICAN: 81 mL/min — AB (ref >90)
Glucose, Bld: 88 mg/dL (ref 70–99)
Potassium: 4.7 mmol/L (ref 3.5–5.1)
Sodium: 134 mmol/L — ABNORMAL LOW (ref 135–145)
TOTAL PROTEIN: 8.1 g/dL (ref 6.0–8.3)
Total Bilirubin: 0.9 mg/dL (ref 0.3–1.2)

## 2014-10-23 LAB — BRAIN NATRIURETIC PEPTIDE: B Natriuretic Peptide: 53.1 pg/mL (ref 0.0–100.0)

## 2014-10-23 LAB — PROTIME-INR
INR: 2.61 — AB (ref 0.00–1.49)
Prothrombin Time: 28.2 seconds — ABNORMAL HIGH (ref 11.6–15.2)

## 2014-10-23 LAB — SEDIMENTATION RATE: SED RATE: 94 mm/h — AB (ref 0–16)

## 2014-10-23 LAB — URIC ACID: Uric Acid, Serum: 11.8 mg/dL — ABNORMAL HIGH (ref 4.0–7.8)

## 2014-10-23 LAB — CK: Total CK: 96 U/L (ref 7–232)

## 2014-10-23 LAB — DIGOXIN LEVEL: Digoxin Level: 0.6 ng/mL — ABNORMAL LOW (ref 0.8–2.0)

## 2014-10-23 MED ORDER — WARFARIN - PHARMACIST DOSING INPATIENT: Freq: Every day | Status: DC

## 2014-10-23 MED ORDER — ONDANSETRON HCL 4 MG/2ML IJ SOLN
4.0000 mg | Freq: Once | INTRAMUSCULAR | Status: AC
  Administered 2014-10-23: 4 mg via INTRAVENOUS
  Filled 2014-10-23: qty 2

## 2014-10-23 MED ORDER — SODIUM CHLORIDE 0.9 % IJ SOLN
3.0000 mL | Freq: Two times a day (BID) | INTRAMUSCULAR | Status: DC
  Administered 2014-10-23 – 2014-10-26 (×6): 3 mL via INTRAVENOUS

## 2014-10-23 MED ORDER — ONDANSETRON HCL 4 MG PO TABS: 4.0000 mg | ORAL_TABLET | Freq: Four times a day (QID) | ORAL | Status: DC | PRN

## 2014-10-23 MED ORDER — COLCHICINE 0.6 MG PO TABS: 0.6000 mg | ORAL_TABLET | Freq: Once | ORAL | Status: DC

## 2014-10-23 MED ORDER — WARFARIN SODIUM 5 MG PO TABS
5.0000 mg | ORAL_TABLET | Freq: Once | ORAL | Status: AC
  Administered 2014-10-23: 5 mg via ORAL
  Filled 2014-10-23: qty 1

## 2014-10-23 MED ORDER — DILTIAZEM HCL ER COATED BEADS 240 MG PO CP24
240.0000 mg | ORAL_CAPSULE | Freq: Every day | ORAL | Status: DC
  Administered 2014-10-23 – 2014-10-27 (×5): 240 mg via ORAL
  Filled 2014-10-23 (×5): qty 1

## 2014-10-23 MED ORDER — RAMIPRIL 5 MG PO CAPS
5.0000 mg | ORAL_CAPSULE | Freq: Every day | ORAL | Status: DC
  Administered 2014-10-23 – 2014-10-24 (×2): 5 mg via ORAL
  Filled 2014-10-23 (×2): qty 1

## 2014-10-23 MED ORDER — OXYCODONE HCL 5 MG PO TABS
5.0000 mg | ORAL_TABLET | ORAL | Status: DC | PRN
  Administered 2014-10-23 – 2014-10-27 (×3): 5 mg via ORAL
  Filled 2014-10-23 (×3): qty 1

## 2014-10-23 MED ORDER — DIGOXIN 250 MCG PO TABS
250.0000 ug | ORAL_TABLET | Freq: Every day | ORAL | Status: DC
  Administered 2014-10-23 – 2014-10-27 (×5): 250 ug via ORAL
  Filled 2014-10-23 (×5): qty 1

## 2014-10-23 MED ORDER — ONDANSETRON HCL 4 MG/2ML IJ SOLN: 4.0000 mg | Freq: Four times a day (QID) | INTRAMUSCULAR | Status: DC | PRN

## 2014-10-23 MED ORDER — HYDROMORPHONE HCL 1 MG/ML IJ SOLN
1.0000 mg | INTRAMUSCULAR | Status: DC | PRN
  Administered 2014-10-23 – 2014-10-27 (×13): 1 mg via INTRAVENOUS
  Filled 2014-10-23 (×13): qty 1

## 2014-10-23 MED ORDER — COLCHICINE 0.6 MG PO TABS
0.6000 mg | ORAL_TABLET | Freq: Every day | ORAL | Status: DC
  Administered 2014-10-24 – 2014-10-27 (×4): 0.6 mg via ORAL
  Filled 2014-10-23 (×4): qty 1

## 2014-10-23 MED ORDER — ACETAMINOPHEN 325 MG PO TABS: 650.0000 mg | ORAL_TABLET | Freq: Four times a day (QID) | ORAL | Status: DC | PRN

## 2014-10-23 MED ORDER — CARVEDILOL 12.5 MG PO TABS
12.5000 mg | ORAL_TABLET | Freq: Two times a day (BID) | ORAL | Status: DC
  Administered 2014-10-23 – 2014-10-27 (×10): 12.5 mg via ORAL
  Filled 2014-10-23 (×11): qty 1

## 2014-10-23 MED ORDER — SPIRONOLACTONE 12.5 MG HALF TABLET
12.5000 mg | ORAL_TABLET | Freq: Every day | ORAL | Status: DC
  Administered 2014-10-23 – 2014-10-24 (×2): 12.5 mg via ORAL
  Filled 2014-10-23 (×2): qty 1

## 2014-10-23 MED ORDER — FENTANYL CITRATE 0.05 MG/ML IJ SOLN
50.0000 ug | Freq: Once | INTRAMUSCULAR | Status: AC
  Administered 2014-10-23: 50 ug via INTRAVENOUS
  Filled 2014-10-23: qty 2

## 2014-10-23 MED ORDER — ACETAMINOPHEN 650 MG RE SUPP: 650.0000 mg | Freq: Four times a day (QID) | RECTAL | Status: DC | PRN

## 2014-10-23 MED ORDER — FUROSEMIDE 40 MG PO TABS
40.0000 mg | ORAL_TABLET | Freq: Two times a day (BID) | ORAL | Status: DC
  Administered 2014-10-23 – 2014-10-24 (×3): 40 mg via ORAL
  Filled 2014-10-23 (×5): qty 1

## 2014-10-23 MED ORDER — HYDROMORPHONE HCL 1 MG/ML IJ SOLN
1.0000 mg | Freq: Once | INTRAMUSCULAR | Status: AC
  Administered 2014-10-23: 1 mg via INTRAVENOUS
  Filled 2014-10-23: qty 1

## 2014-10-23 MED ORDER — COLCHICINE 0.6 MG PO TABS
1.2000 mg | ORAL_TABLET | Freq: Once | ORAL | Status: AC
  Administered 2014-10-23: 1.2 mg via ORAL
  Filled 2014-10-23: qty 2
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
## 2014-10-23 NOTE — Progress Notes (Addendum)
ANTICOAGULATION CONSULT NOTE - Initial Consult  Pharmacy Consult for warfarin Indication: atrial fibrillation  No Known Allergies  Patient Measurements: Height: 5\' 11"  (180.3 cm) Weight: (!) 444 lb 11.2 oz (201.715 kg) IBW/kg (Calculated) : 75.3   Vital Signs: Temp: 99.1 F (37.3 C) (03/07 0531) Temp Source: Oral (03/07 0531) BP: 126/69 mmHg (03/07 0533) Pulse Rate: 96 (03/07 0533)  Labs:  Recent Labs  10/23/14 0015  HGB 16.0  HCT 47.9  PLT 315  LABPROT 28.2*  INR 2.61*  CREATININE 1.18  CKTOTAL 96    Estimated Creatinine Clearance: 131.9 mL/min (by C-G formula based on Cr of 1.18).   Medical History: Past Medical History  Diagnosis Date  . Atrial fibrillation, permanent     Failed DCCV 04/2008  . Nonischemic cardiomyopathy      Last Echo 01/28/2008, EF 20%, last cardiac cath 2009 ( Lt & Rt) with normal cors  . Morbid obesity with BMI of 60.0-69.9, adult   . Chronic anticoagulation     on coumadin  . OSA (obstructive sleep apnea)     has not had sleep study, confict with his schedule  . Hypertension     Medications:  Scheduled:  . carvedilol  12.5 mg Oral BID WC  . digoxin  250 mcg Oral Daily  . diltiazem  240 mg Oral Daily  . furosemide  40 mg Oral BID  . ramipril  5 mg Oral Daily  . sodium chloride  3 mL Intravenous Q12H  . spironolactone  12.5 mg Oral Daily   Infusions:   PRN: acetaminophen **OR** acetaminophen, HYDROmorphone (DILAUDID) injection, ondansetron **OR** ondansetron (ZOFRAN) IV, oxyCODONE  Assessment: 54 y/o M with h/o nonischemic cardiomyopathy (EF 55% on 01/02/2014), on warfarin for atrial fibrillation, admitted with bilateral severe ankle pain and swelling that prevent ambulation. Ankle X-rays were negative for acute bony abnormalities. Orders received to continue warfarin with pharmacy dosing assistance as inpatient.      INR therapeutic (2.61) on admission.   Interviewed patient to confirm home warfarin regimen.  He reports dosage  is 5 mg daily except 10 mg on Tuesdays and Saturdays, but that he did not take yesterday's dose, so was last taken on 3/5.  Drug interactions with warfarin:   None major Diet:   Heart Healthy  Goal Range:  INR 2-3    Plan:  1. Warfarin 5 mg PO x 1 today as per patient's usual regimen. 2. PT/INR daily while inpatient. 3. Follow clinical course.  Elie Goody, PharmD, BCPS Pager: [phone] 10/23/2014  6:27 AM

## 2014-10-23 NOTE — Progress Notes (Signed)
CARE MANAGEMENT NOTE 10/23/2014  Patient:  Travis Bautista, Travis Bautista   Account Number:  [id number]  Date Initiated:  10/23/2014  Documentation initiated by:  Ferdinand Cava  Subjective/Objective Assessment:   54 yo male admitted with bilateral ankle pain from home     Action/Plan:   discharge planning   Anticipated DC Date:  10/24/2014   Anticipated DC Plan:    In-house referral  Clinical Social Worker      DC Planning Services  CM consult      Choice offered to / List presented to:             Status of service:  In process, will continue to follow Medicare Important Message given?   (If response is "NO", the following Medicare IM given date fields will be blank) Date Medicare IM given:   Medicare IM given by:   Date Additional Medicare IM given:   Additional Medicare IM given by:    Discharge Disposition:    Per UR Regulation:    If discussed at Long Length of Stay Meetings, dates discussed:    Comments:  10/23/14 Ferdinand Cava RN BSN CM [phone] Patient lives at home with spouse and son and has no DME. He stated that he has needed DME and since he has developed the ankle pain his son has been helping him get out of bed. He has a PCP, cardilogist, and pharmacy. He stated that he preferes to go to a facility for rehab before returning home. Made CSW aware. Awaiting PT recommendations.

## 2014-10-23 NOTE — Evaluation (Signed)
Physical Therapy Evaluation Patient Details Name: Travis Bautista MRN: [id number] DOB: Jan 09, 1961 Today's Date: 10/23/2014   History of Present Illness  54 yo male adm with bil ankle pain; PMHx: morbid obesity, OSA, HTN  Clinical Impression  Pt admitted with above diagnosis. Pt currently with functional limitations due to the deficits listed below (see PT Problem List).  Pt will benefit from skilled PT to increase their independence and safety with mobility to allow discharge to the venue listed below.  Likely will be able to D/C home with HHPT, pt mentioned and interest in rehab at SNF but he has no insurance; will continue to follow for D/C  needs     Follow Up Recommendations Home health PT    Equipment Recommendations  Rolling walker with 5" wheels (bariatric)    Recommendations for Other Services       Precautions / Restrictions Precautions Precautions: Fall      Mobility  Bed Mobility Overal bed mobility: Needs Assistance Bed Mobility: Supine to Sit;Sit to Supine     Supine to sit: Min assist;+2 for physical assistance Sit to supine: +2 for physical assistance;Min assist   General bed mobility comments: incr time, assist with LEs  Transfers                 General transfer comment: NT d/t pain LEs  Ambulation/Gait                Stairs            Wheelchair Mobility    Modified Rankin (Stroke Patients Only)       Balance                                             Pertinent Vitals/Pain Pain Assessment: 0-10 Pain Score: 10-Worst pain ever Pain Location: with any movement bil ankles  Pain Intervention(s): Limited activity within patient's tolerance    Home Living Family/patient expects to be discharged to:: Private residence Living Arrangements: Spouse/significant other;Children   Type of Home: House       Home Layout: Multi-level Home Equipment: None Additional Comments: pt has lost 44lbs in 4 wks    Prior  Function Level of Independence: Independent               Hand Dominance        Extremity/Trunk Assessment   Upper Extremity Assessment: Overall WFL for tasks assessed           Lower Extremity Assessment: Generalized weakness         Communication   Communication: No difficulties  Cognition Arousal/Alertness: Awake/alert Behavior During Therapy: WFL for tasks assessed/performed Overall Cognitive Status: Within Functional Limits for tasks assessed                      General Comments      Exercises        Assessment/Plan    PT Assessment Patient needs continued PT services  PT Diagnosis Difficulty walking   PT Problem List Decreased strength;Decreased activity tolerance;Decreased mobility;Decreased range of motion;Obesity  PT Treatment Interventions DME instruction;Gait training;Functional mobility training;Therapeutic activities;Patient/family education;Therapeutic exercise   PT Goals (Current goals can be found in the Care Plan section) Acute Rehab PT Goals Patient Stated Goal: to get back to normal, decr pain PT Goal Formulation: With patient Time For Goal Achievement: 11/06/14 Potential  to Achieve Goals: Good    Frequency Min 3X/week   Barriers to discharge        Co-evaluation               End of Session   Activity Tolerance: Patient limited by pain Patient left: with call bell/phone within reach;in bed Nurse Communication: Mobility status         Time: 2458-0998 PT Time Calculation (min) (ACUTE ONLY): 26 min   Charges:   PT Evaluation $Initial PT Evaluation Tier I: 1 Procedure PT Treatments $Therapeutic Activity: 8-22 mins   PT G Codes:        Travis Bautista 03-Nov-2014, 4:25 PM
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
## 2014-10-24 DIAGNOSIS — M25571 Pain in right ankle and joints of right foot: Secondary | ICD-10-CM | POA: Diagnosis present

## 2014-10-24 DIAGNOSIS — I482 Chronic atrial fibrillation: Secondary | ICD-10-CM | POA: Diagnosis present

## 2014-10-24 DIAGNOSIS — I10 Essential (primary) hypertension: Secondary | ICD-10-CM | POA: Diagnosis present

## 2014-10-24 DIAGNOSIS — Z7901 Long term (current) use of anticoagulants: Secondary | ICD-10-CM | POA: Diagnosis not present

## 2014-10-24 DIAGNOSIS — Z6841 Body Mass Index (BMI) 40.0 and over, adult: Secondary | ICD-10-CM | POA: Diagnosis not present

## 2014-10-24 DIAGNOSIS — N179 Acute kidney failure, unspecified: Secondary | ICD-10-CM | POA: Diagnosis present

## 2014-10-24 DIAGNOSIS — Z87891 Personal history of nicotine dependence: Secondary | ICD-10-CM | POA: Diagnosis not present

## 2014-10-24 DIAGNOSIS — M10072 Idiopathic gout, left ankle and foot: Secondary | ICD-10-CM

## 2014-10-24 DIAGNOSIS — I481 Persistent atrial fibrillation: Secondary | ICD-10-CM | POA: Diagnosis present

## 2014-10-24 DIAGNOSIS — I255 Ischemic cardiomyopathy: Secondary | ICD-10-CM | POA: Diagnosis present

## 2014-10-24 DIAGNOSIS — M25572 Pain in left ankle and joints of left foot: Secondary | ICD-10-CM | POA: Diagnosis present

## 2014-10-24 DIAGNOSIS — L732 Hidradenitis suppurativa: Secondary | ICD-10-CM | POA: Diagnosis present

## 2014-10-24 DIAGNOSIS — E871 Hypo-osmolality and hyponatremia: Secondary | ICD-10-CM | POA: Diagnosis present

## 2014-10-24 DIAGNOSIS — M109 Gout, unspecified: Secondary | ICD-10-CM | POA: Diagnosis present

## 2014-10-24 DIAGNOSIS — D72829 Elevated white blood cell count, unspecified: Secondary | ICD-10-CM | POA: Diagnosis present

## 2014-10-24 DIAGNOSIS — E860 Dehydration: Secondary | ICD-10-CM | POA: Diagnosis present

## 2014-10-24 DIAGNOSIS — G4733 Obstructive sleep apnea (adult) (pediatric): Secondary | ICD-10-CM | POA: Diagnosis present

## 2014-10-24 LAB — BASIC METABOLIC PANEL
ANION GAP: 8 (ref 5–15)
BUN: 37 mg/dL — ABNORMAL HIGH (ref 6–23)
CO2: 33 mmol/L — AB (ref 19–32)
Calcium: 8.9 mg/dL (ref 8.4–10.5)
Chloride: 88 mmol/L — ABNORMAL LOW (ref 96–112)
Creatinine, Ser: 2.38 mg/dL — ABNORMAL HIGH (ref 0.50–1.35)
GFR calc Af Amer: 35 mL/min — ABNORMAL LOW (ref >90)
GFR calc non Af Amer: 30 mL/min — ABNORMAL LOW (ref >90)
Glucose, Bld: 99 mg/dL (ref 70–99)
Potassium: 4.8 mmol/L (ref 3.5–5.1)
SODIUM: 129 mmol/L — AB (ref 135–145)

## 2014-10-24 LAB — PROTIME-INR
INR: 2.51 — ABNORMAL HIGH (ref 0.00–1.49)
PROTHROMBIN TIME: 27.3 s — AB (ref 11.6–15.2)

## 2014-10-24 MED ORDER — SODIUM CHLORIDE 0.9 % IV SOLN
INTRAVENOUS | Status: DC
Start: 2014-10-24 — End: 2014-10-26
  Administered 2014-10-24 – 2014-10-25 (×2): via INTRAVENOUS

## 2014-10-24 MED ORDER — WARFARIN SODIUM 10 MG PO TABS
10.0000 mg | ORAL_TABLET | Freq: Once | ORAL | Status: AC
  Administered 2014-10-24: 10 mg via ORAL
  Filled 2014-10-24: qty 1

## 2014-10-24 MED ORDER — SODIUM CHLORIDE 0.9 % IV BOLUS (SEPSIS)
500.0000 mL | Freq: Once | INTRAVENOUS | Status: AC
  Administered 2014-10-24: 500 mL via INTRAVENOUS

## 2014-10-24 NOTE — Progress Notes (Signed)
   10/23/14 1624  PT G-Codes **NOT FOR INPATIENT CLASS**  Functional Assessment Tool Used clincal judgement  Functional Limitation Mobility: Walking and moving around  Mobility: Walking and Moving Around Current Status (H6808) CJ  Mobility: Walking and Moving Around Goal Status (U1103) CI

## 2014-10-24 NOTE — Progress Notes (Signed)
UR completed 
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
## 2014-10-24 NOTE — Progress Notes (Signed)
PT Cancellation Note  Patient Details Name: Travis Bautista MRN: [id number] DOB: September 17, 1960   Cancelled Treatment:     PT session withheld due to pt's pain level.  NT assisted pt to EOB to attempt transfer to [facility] however pt was unable to even "place my feet on the floor".  "The pain was just to bad for me to try to get up" in B LE.  Will re attempt tomorrow.    Felecia Shelling  PTA [facility]  Acute  Rehab Pager      [phone]

## 2014-10-24 NOTE — Progress Notes (Signed)
ANTICOAGULATION CONSULT NOTE  Pharmacy Consult for warfarin Indication: atrial fibrillation  No Known Allergies  Patient Measurements: Height: 5\' 11"  (180.3 cm) Weight: (!) 449 lb (203.665 kg) IBW/kg (Calculated) : 75.3   Vital Signs: Temp: 99.2 F (37.3 C) (03/08 0600) Temp Source: Oral (03/08 0600) BP: 113/65 mmHg (03/08 0600) Pulse Rate: 85 (03/08 0600)  Labs:  Recent Labs  10/23/14 0015 10/23/14 0650 10/24/14 0535  HGB 16.0 15.6  --   HCT 47.9 47.7  --   PLT 315 314  --   LABPROT 28.2*  --  27.3*  INR 2.61*  --  2.51*  CREATININE 1.18 1.16  --   CKTOTAL 96  --   --     Estimated Creatinine Clearance: 135 mL/min (by C-G formula based on Cr of 1.16).   Medical History: Past Medical History  Diagnosis Date  . Atrial fibrillation, permanent     Failed DCCV 04/2008  . Nonischemic cardiomyopathy      Last Echo 01/28/2008, EF 20%, last cardiac cath 2009 ( Lt & Rt) with normal cors  . Morbid obesity with BMI of 60.0-69.9, adult   . Chronic anticoagulation     on coumadin  . OSA (obstructive sleep apnea)     has not had sleep study, confict with his schedule  . Hypertension     Medications:  Scheduled:  . carvedilol  12.5 mg Oral BID WC  . colchicine  0.6 mg Oral Daily  . digoxin  250 mcg Oral Daily  . diltiazem  240 mg Oral Daily  . furosemide  40 mg Oral BID  . ramipril  5 mg Oral Daily  . sodium chloride  3 mL Intravenous Q12H  . spironolactone  12.5 mg Oral Daily  . warfarin  10 mg Oral ONCE-1800  . Warfarin - Pharmacist Dosing Inpatient   Does not apply q1800   Infusions:   PRN: acetaminophen **OR** acetaminophen, HYDROmorphone (DILAUDID) injection, ondansetron **OR** ondansetron (ZOFRAN) IV, oxyCODONE  Assessment: 54 y/o M with h/o nonischemic cardiomyopathy (EF 55% on 01/02/2014), on warfarin for atrial fibrillation, admitted with bilateral severe ankle pain and swelling that prevent ambulation. Ankle X-rays were negative for acute bony  abnormalities. Orders received to continue warfarin with pharmacy dosing assistance as inpatient.      INR therapeutic (2.61) on admission.   Interviewed patient to confirm home warfarin regimen.  He reports dosage is 5 mg daily except 10 mg on Tuesdays and Saturdays, but that he did not take 3/6 dose, so was last taken on 3/5.  Today, 10/24/2014:  INR remains therapeutic at 2.51  Drug interactions with warfarin: None major  Diet: Heart Healthy  H/H, Pltc WNL (3/7)  No bleeding issues reported  No nausea, diarrhea reported per patient  Goal Range:  INR 2-3   Plan:  1. Warfarin 10 mg PO x 1 today as per patient's usual regimen. 2. PT/INR daily while inpatient. 3. Follow clinical course.   Greer Pickerel, PharmD, BCPS Pager: [phone] 10/24/2014 12:17 PM

## 2014-10-25 DIAGNOSIS — N179 Acute kidney failure, unspecified: Secondary | ICD-10-CM

## 2014-10-25 LAB — CBC
HCT: 43.7 % (ref 39.0–52.0)
Hemoglobin: 14.5 g/dL (ref 13.0–17.0)
MCH: 32.2 pg (ref 26.0–34.0)
MCHC: 33.2 g/dL (ref 30.0–36.0)
MCV: 97.1 fL (ref 78.0–100.0)
Platelets: 225 10*3/uL (ref 150–400)
RBC: 4.5 MIL/uL (ref 4.22–5.81)
RDW: 13.8 % (ref 11.5–15.5)
WBC: 6.8 10*3/uL (ref 4.0–10.5)

## 2014-10-25 LAB — BASIC METABOLIC PANEL
Anion gap: 9 (ref 5–15)
BUN: 33 mg/dL — ABNORMAL HIGH (ref 6–23)
CHLORIDE: 93 mmol/L — AB (ref 96–112)
CO2: 32 mmol/L (ref 19–32)
Calcium: 9 mg/dL (ref 8.4–10.5)
Creatinine, Ser: 1.59 mg/dL — ABNORMAL HIGH (ref 0.50–1.35)
GFR, EST AFRICAN AMERICAN: 56 mL/min — AB (ref >90)
GFR, EST NON AFRICAN AMERICAN: 49 mL/min — AB (ref >90)
GLUCOSE: 99 mg/dL (ref 70–99)
Potassium: 4.6 mmol/L (ref 3.5–5.1)
SODIUM: 134 mmol/L — AB (ref 135–145)

## 2014-10-25 LAB — PROTIME-INR
INR: 1.68 — ABNORMAL HIGH (ref 0.00–1.49)
PROTHROMBIN TIME: 20 s — AB (ref 11.6–15.2)

## 2014-10-25 MED ORDER — WARFARIN SODIUM 7.5 MG PO TABS
7.5000 mg | ORAL_TABLET | Freq: Once | ORAL | Status: AC
  Administered 2014-10-25: 7.5 mg via ORAL
  Filled 2014-10-25: qty 1

## 2014-10-25 MED ORDER — ALLOPURINOL 300 MG PO TABS
300.0000 mg | ORAL_TABLET | Freq: Every day | ORAL | Status: DC
  Administered 2014-10-25 – 2014-10-27 (×3): 300 mg via ORAL
  Filled 2014-10-25 (×3): qty 1

## 2014-10-25 MED ORDER — PREDNISONE 50 MG PO TABS
60.0000 mg | ORAL_TABLET | Freq: Every day | ORAL | Status: AC
  Administered 2014-10-25 – 2014-10-27 (×3): 60 mg via ORAL
  Filled 2014-10-25 (×4): qty 1

## 2014-10-25 MED ORDER — DOXYCYCLINE HYCLATE 100 MG PO TABS
100.0000 mg | ORAL_TABLET | Freq: Two times a day (BID) | ORAL | Status: DC
  Administered 2014-10-25 – 2014-10-27 (×4): 100 mg via ORAL
  Filled 2014-10-25 (×5): qty 1

## 2014-10-25 NOTE — Progress Notes (Signed)
PT Cancellation Note  Patient Details Name: Travis Bautista MRN: [id number] DOB: [birth year]   Cancelled Treatment:     pt unable to tolerate due to pain level still too high.   Felecia Shelling  PTA [facility]  Acute  Rehab Pager      [phone]

## 2014-10-25 NOTE — Consult Note (Addendum)
WOC wound consult note Reason for Consult: Consult requested for "boils" to inner perineum.  Pt states he had this problem for "about 9 months; areas will rupture and drain pus then improve, and reoccur again later".  He was wearing tight underwear recently which he feels exacerbated the problem by rubbing against the sites and states he also has frequent moisture to the affected areas. Pt states areas are only moderately uncomfortable when probed. Wound type: Full thickness wounds to bilat inner perineum.   Left inner groin 8X4 cm raised above skin level, fluctuant, darker red in color than surrounding skin.  .1X.1cm area at 12:00 o'clock with mod amt thick tan pus draining, no odor.  Unable to insert swab deeper than .2cm.   Right inner groin 6X4 cm raised above skin level, fluctuant, darker red in color than surrounding skin.  .1X.1cm area at 6:00 o'clock with mod amt thick tan-red draining, no odor. Swab inserted to 1 cm.   Dressing procedure/placement/frequency: Discussed plan of care with primary team. Topical treatment will not be effective to resolve these wounds; opening to both sites is to small to allow adequate drainage of pus.  Wound culture obtained.   Pt could benefit from I&D to these sites. Recommend surgical consult for further plan of care.    Please re-consult if further assistance is needed.  Thank-you,  Cammie Mcgee MSN, RN, CWOCN, [place], CNS [phone]
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
## 2014-10-25 NOTE — Progress Notes (Signed)
ANTICOAGULATION CONSULT NOTE  Pharmacy Consult for warfarin Indication: atrial fibrillation  No Known Allergies  Patient Measurements: Height: 5\' 11"  (180.3 cm) Weight: (!) 451 lb (204.572 kg) IBW/kg (Calculated) : 75.3   Vital Signs: Temp: 99 F (37.2 C) (03/09 0529) Temp Source: Oral (03/09 0529) BP: 109/56 mmHg (03/09 0529) Pulse Rate: 87 (03/09 0529)  Labs:  Recent Labs  10/23/14 0015 10/23/14 0650 10/24/14 0535 10/25/14 0535  HGB 16.0 15.6  --  14.5  HCT 47.9 47.7  --  43.7  PLT 315 314  --  225  LABPROT 28.2*  --  27.3* 20.0*  INR 2.61*  --  2.51* 1.68*  CREATININE 1.18 1.16 2.38* 1.59*  CKTOTAL 96  --   --   --     Estimated Creatinine Clearance: 98.7 mL/min (by C-G formula based on Cr of 1.59).   Medical History: Past Medical History  Diagnosis Date  . Atrial fibrillation, permanent     Failed DCCV 04/2008  . Nonischemic cardiomyopathy      Last Echo 01/28/2008, EF 20%, last cardiac cath 2009 ( Lt & Rt) with normal cors  . Morbid obesity with BMI of 60.0-69.9, adult   . Chronic anticoagulation     on coumadin  . OSA (obstructive sleep apnea)     has not had sleep study, confict with his schedule  . Hypertension     Medications:  Scheduled:  . carvedilol  12.5 mg Oral BID WC  . colchicine  0.6 mg Oral Daily  . digoxin  250 mcg Oral Daily  . diltiazem  240 mg Oral Daily  . sodium chloride  3 mL Intravenous Q12H  . Warfarin - Pharmacist Dosing Inpatient   Does not apply q1800   Infusions:  . sodium chloride 75 mL/hr at 10/24/14 1609   PRN: acetaminophen **OR** acetaminophen, HYDROmorphone (DILAUDID) injection, ondansetron **OR** ondansetron (ZOFRAN) IV, oxyCODONE  Assessment: 54 y/o M with h/o nonischemic cardiomyopathy (EF 55% on 01/02/2014), on warfarin for atrial fibrillation, admitted with bilateral severe ankle pain and swelling that prevent ambulation. Ankle X-rays were negative for acute bony abnormalities. Orders received to continue  warfarin with pharmacy dosing assistance as inpatient.      INR therapeutic (2.61) on admission.   Interviewed patient to confirm home warfarin regimen.  He reports dosage is 5 mg daily except 10 mg on Tuesdays and Saturdays, but that he did not take 3/6 dose, so was last taken on 3/5.  Today, 10/25/2014:  INR subtherapeutic today despite continuation of warfarin at patient's PTA regimen.     Drug interactions with warfarin: None major  Diet: Heart Healthy  Patient notes he is changing his diet in hopes of losing weight.  Has been eating more green leafy vegetables (had broccoli at two meals yesterday). Anticipate warfarin requirements may increase as a result of this dietary change.  Encouraged him to continue a healthy diet with guidance from his physician.  H/H, Pltc WNL   No bleeding issues reported  Goal Range:  INR 2-3   Plan:  1. Increase today's warfarin dose to 7.5 mg x 1. 2. PT/INR daily while inpatient. 3. Anticipate higher ongoing warfarin dosage requirements if patient maintains current healthier diet. 4. Follow clinical course.  Elie Goody, PharmD, BCPS Pager: [phone] 10/25/2014  8:16 AM

## 2014-10-25 NOTE — Progress Notes (Signed)
Discussed in detail with wound care, boils to the inner perineum area may need I&D  Called surgery consult, d/w Will Lowella Grip M.D. [facility] 10/25/2014, 3:12 PM  Pager: [phone]
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
## 2014-10-25 NOTE — Progress Notes (Signed)
Patient ID: Travis Bautista, male   DOB: 1961-04-04, 54 y.o.   MRN: [id number] Travis Bautista Jan 14, 1961  [id number].   Requesting MD: Dr. Estill Cotta Chief Complaint/Reason for Consult: perineal abscess HPI: This is a morbidly obese 54 yo white male who has chronic a fib on coumadin and an ischemic cardiomyopathy who for the last 9 months has begun having some drainage from these areas in his groin.  He has one area on each side.  He has never sought treatment for these areas.  He denies fevers or chills.  He states that at times they will drain and then at time they will "dry up."    He was admitted for ankle pain due to gout and ARF.  He was noted today to have these areas and the WOC, RN recommended surgical evaluation.  He has no fever, no real pain, or elevation of his WBC.  We have been asked to evaluate him for further recommendations.    ROS : Please see HPI, otherwise negative except for his bilateral ankle pain.  History reviewed. No pertinent family history.  Past Medical History  Diagnosis Date  . Atrial fibrillation, permanent     Failed DCCV 04/2008  . Nonischemic cardiomyopathy      Last Echo 01/28/2008, EF 20%, last cardiac cath 2009 ( [facility]) with normal cors  . Morbid obesity with BMI of 60.0-69.9, adult   . Chronic anticoagulation     on coumadin  . OSA (obstructive sleep apnea)     has not had sleep study, confict with his schedule  . Hypertension     Past Surgical History  Procedure Laterality Date  . Cardiac catheterization  01/31/2008    patent cors, CO/CI 4.8/1.6, PA 65/33 mean 43; PCW 35/41, mean 35; Ra 31/35; RV mean31    Social History:  reports that he has never smoked. He quit smokeless tobacco use about 17 years ago. His smokeless tobacco use included Chew. He reports that he drinks alcohol. He reports that he does not use illicit drugs.  Allergies: No Known Allergies  Medications Prior to Admission  Medication Sig Dispense Refill  . DIGOX 250 MCG tablet  TAKE ONE TABLET BY MOUTH ONE TIME DAILY  30 tablet 1  . diltiazem (CARDIZEM CD) 240 MG 24 hr capsule Take 1 capsule (240 mg total) by mouth daily. Need appointment before further refills 30 capsule 0  . furosemide (LASIX) 40 MG tablet Take 1 tablet (40 mg total) by mouth 2 (two) times daily. <please make appointment for refills> 60 tablet 0  . Multiple Vitamin (MULTIVITAMIN) tablet Take 1 tablet by mouth daily.    . ramipril (ALTACE) 5 MG capsule Take 1 capsule (5 mg total) by mouth daily. Need appointment before further refills 30 capsule 0  . spironolactone (ALDACTONE) 25 MG tablet TAKE ONE-HALF TABLET BY MOUTH EVERY DAY  (Patient taking differently: TAKE ONE-HALF TABLET BY MOUTH EVERY DAY) 15 tablet 11  . warfarin (COUMADIN) 5 MG tablet Take 5-10 mg by mouth daily. Take 1 tablet (11m) every day except Tuesday and Saturday. Tuesday and Saturday take 2 tablets (144m    . warfarin (COUMADIN) 5 MG tablet take 1 to 1 & 1/2 tablet by mouth daily as directed by the coumadin clinic.  (Patient not taking: Reported on 10/23/2014) 40 tablet 0    Blood pressure 112/56, pulse 86, temperature 98.6 F (37 C), temperature source Oral, resp. rate 18, height '5\' 11"'  (1.803 m), weight 451 lb (204.572 kg),  SpO2 93 %. Physical Exam: General: morbidly obese white male who is laying in bed in NAD HEENT: head is normocephalic, atraumatic.  Sclera are noninjected.  PERRL.  Ears and nose without any masses or lesions.  Mouth is pink and moist Heart: irregularly irregular, normal rate.  Normal s1,s2. No obvious murmurs, gallops, or rubs noted.  Palpable radial and pedal pulses bilaterally Lungs: CTAB, no wheezes, rhonchi, or rales noted.  Respiratory effort nonlabored Abd: soft, NT, ND, +BS, no masses, hernias, morbidly obese MS: all 4 extremities are symmetrical with no cyanosis, clubbing Skin: two areas of hypergranulation tissue and chronic sinus tracts with some cloudy purulent drainage noted on the right and left  inner thighs.  The left wound measures 3x10cm and right is 3x12cm.  Several chronic tracts are noted on both of these areas.  There are a few smaller areas of fluctuance, but the greatest amount of this tissues seems hypergranular with induration, likely indicating no major acute infection, but chronic drainage. Psych: A&Ox3 with an appropriate affect.    Results for orders placed or performed during the hospital encounter of 10/22/14 (from the past 48 hour(s))  Protime-INR     Status: Abnormal   Collection Time: 10/24/14  5:35 AM  Result Value Ref Range   Prothrombin Time 27.3 (H) 11.6 - 15.2 seconds   INR 2.51 (H) 0.00 - 6.38  Basic metabolic panel     Status: Abnormal   Collection Time: 10/24/14  5:35 AM  Result Value Ref Range   Sodium 129 (L) 135 - 145 mmol/L   Potassium 4.8 3.5 - 5.1 mmol/L   Chloride 88 (L) 96 - 112 mmol/L   CO2 33 (H) 19 - 32 mmol/L   Glucose, Bld 99 70 - 99 mg/dL   BUN 37 (H) 6 - 23 mg/dL    Comment: REPEATED TO VERIFY DELTA CHECK NOTED    Creatinine, Ser 2.38 (H) 0.50 - 1.35 mg/dL    Comment: REPEATED TO VERIFY DELTA CHECK NOTED    Calcium 8.9 8.4 - 10.5 mg/dL   GFR calc non Af Amer 30 (L) >90 mL/min   GFR calc Af Amer 35 (L) >90 mL/min    Comment: (NOTE) The eGFR has been calculated using the CKD EPI equation. This calculation has not been validated in all clinical situations. eGFR's persistently <90 mL/min signify possible Chronic Kidney Disease.    Anion gap 8 5 - 15  Protime-INR     Status: Abnormal   Collection Time: 10/25/14  5:35 AM  Result Value Ref Range   Prothrombin Time 20.0 (H) 11.6 - 15.2 seconds   INR 1.68 (H) 0.00 - 4.66  Basic metabolic panel     Status: Abnormal   Collection Time: 10/25/14  5:35 AM  Result Value Ref Range   Sodium 134 (L) 135 - 145 mmol/L   Potassium 4.6 3.5 - 5.1 mmol/L   Chloride 93 (L) 96 - 112 mmol/L   CO2 32 19 - 32 mmol/L   Glucose, Bld 99 70 - 99 mg/dL   BUN 33 (H) 6 - 23 mg/dL   Creatinine, Ser  1.59 (H) 0.50 - 1.35 mg/dL   Calcium 9.0 8.4 - 10.5 mg/dL   GFR calc non Af Amer 49 (L) >90 mL/min   GFR calc Af Amer 56 (L) >90 mL/min    Comment: (NOTE) The eGFR has been calculated using the CKD EPI equation. This calculation has not been validated in all clinical situations. eGFR's persistently <90 mL/min signify  possible Chronic Kidney Disease.    Anion gap 9 5 - 15  CBC     Status: None   Collection Time: 10/25/14  5:35 AM  Result Value Ref Range   WBC 6.8 4.0 - 10.5 K/uL   RBC 4.50 4.22 - 5.81 MIL/uL   Hemoglobin 14.5 13.0 - 17.0 g/dL   HCT 43.7 39.0 - 52.0 %   MCV 97.1 78.0 - 100.0 fL   MCH 32.2 26.0 - 34.0 pg   MCHC 33.2 30.0 - 36.0 g/dL   RDW 13.8 11.5 - 15.5 %   Platelets 225 150 - 400 K/uL    Comment: SPECIMEN CHECKED FOR CLOTS REPEATED TO VERIFY DELTA CHECK NOTED    No results found.     Assessment/Plan 1. Hidradenitis suppurativa -the patient does not seem to have an acute infection that will require surgical incision and drainage.  He will need oral abx therapy treatment with doxycycline for chronic management.  Due to his weight, we will place him on 161m BID.  He will need to follow up with plastic surgery or dermatology as an outpatient for chronic management of this problem. -dry dressings may be placed over these areas to help keep them clean and dry as much as possible to decrease excoriation and overall moisture to that area. -thank you for this consultation.   Travis Bautista E 10/25/2014, 3:44 PM Pager: [phone]

## 2014-10-26 LAB — BASIC METABOLIC PANEL
Anion gap: 10 (ref 5–15)
BUN: 21 mg/dL (ref 6–23)
CO2: 31 mmol/L (ref 19–32)
Calcium: 9.5 mg/dL (ref 8.4–10.5)
Chloride: 99 mmol/L (ref 96–112)
Creatinine, Ser: 0.98 mg/dL (ref 0.50–1.35)
GFR calc Af Amer: >90 mL/min (ref >90)
Glucose, Bld: 113 mg/dL — ABNORMAL HIGH (ref 70–99)
POTASSIUM: 4.4 mmol/L (ref 3.5–5.1)
Sodium: 140 mmol/L (ref 135–145)

## 2014-10-26 LAB — PROTIME-INR
INR: 1.53 — ABNORMAL HIGH (ref 0.00–1.49)
PROTHROMBIN TIME: 18.5 s — AB (ref 11.6–15.2)

## 2014-10-26 LAB — CBC
HEMATOCRIT: 44.8 % (ref 39.0–52.0)
HEMOGLOBIN: 14.7 g/dL (ref 13.0–17.0)
MCH: 32.2 pg (ref 26.0–34.0)
MCHC: 32.8 g/dL (ref 30.0–36.0)
MCV: 98 fL (ref 78.0–100.0)
PLATELETS: 288 10*3/uL (ref 150–400)
RBC: 4.57 MIL/uL (ref 4.22–5.81)
RDW: 13.8 % (ref 11.5–15.5)
WBC: 7.2 10*3/uL (ref 4.0–10.5)

## 2014-10-26 MED ORDER — WARFARIN SODIUM 10 MG PO TABS
10.0000 mg | ORAL_TABLET | Freq: Once | ORAL | Status: AC
  Administered 2014-10-26: 10 mg via ORAL
  Filled 2014-10-26: qty 1

## 2014-10-26 MED ORDER — ENOXAPARIN SODIUM 150 MG/ML ~~LOC~~ SOLN
200.0000 mg | Freq: Two times a day (BID) | SUBCUTANEOUS | Status: DC
  Administered 2014-10-26 (×2): 200 mg via SUBCUTANEOUS
  Filled 2014-10-26 (×7): qty 2

## 2014-10-26 NOTE — Progress Notes (Signed)
Clinical Social Work  Per chart review, PT recommending SNF placement. CSW met with patient at bedside in order to discuss DC plans. Patient reports that he did fairly well with PT but is still interested in rehab. CSW provided SNF list and explained barriers to placement due to insurance and obesity. CSW explained that insurance would still need to approve patient for SNF placement and that several facilities are unable to accept due to not being in network with insurance or patient is over their weight limit. Patient agreeable to SNF search and aware that several counties should be searched due to barriers. After calling several facilities, [facility] or [facility] were only facilities agreeable to consider patient. CSW explained this information to patient. Patient reports he needs to discuss plans with wife but that he would not want to go out of county. CSW inquired about what patient would do if [facility] could not accept and encouraged patient to allow information to be sent to [facility] as an alternative option. Patient agreeable and reports he will follow up with CSW once final decision has been made with him and family. Patient reports he wants to be close to family and unsure if going out of county would be beneficial for his mental health.  CSW completed FL2 and faxed out. Per chart review, MD reported possible DC tomorrow. CSW explained to SNFs if unable to obtain insurance authorization then LOG would need to be considered. SNFs agreeable to discuss plans with their administrators as well.  CSW will continue to follow.  [place], [place] [phone]

## 2014-10-26 NOTE — Progress Notes (Signed)
Clinical Social Work Department CLINICAL SOCIAL WORK PLACEMENT NOTE 10/26/2014  Patient:  Travis Bautista, Travis Bautista  Account Number:  [id number] Admit date:  10/22/2014  Clinical Social Worker:  Unk Lightning, LCSW  Date/time:  10/26/2014 02:20 PM  Clinical Social Work is seeking post-discharge placement for this patient at the following level of care:   SKILLED NURSING   (*CSW will update this form in Epic as items are completed)   10/26/2014  Patient/family provided with [facility] Department of Clinical Social Work's list of facilities offering this level of care within the geographic area requested by the patient (or if unable, by the patient's family).  10/26/2014  Patient/family informed of their freedom to choose among providers that offer the needed level of care, that participate in Medicare, Medicaid or managed care program needed by the patient, have an available bed and are willing to accept the patient.  10/26/2014  Patient/family informed of [facility]' ownership interest in [facility], as well as of the fact that they are under no obligation to receive care at this facility.  PASARR submitted to EDS on 10/26/2014 PASARR number received on 10/26/2014  FL2 transmitted to all facilities in geographic area requested by pt/family on  10/26/2014 FL2 transmitted to all facilities within larger geographic area on   Patient informed that his/her managed care company has contracts with or will negotiate with  certain facilities, including the following:     Patient/family informed of bed offers received:   Patient chooses bed at  Physician recommends and patient chooses bed at    Patient to be transferred to  on   Patient to be transferred to facility by  Patient and family notified of transfer on  Name of family member notified:    The following physician request were entered in Epic:   Additional Comments:
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
## 2014-10-26 NOTE — Progress Notes (Signed)
Clinical Social Work  CSW received a call from patient reporting that he has a friend that works at [employer] and wanted to know if they would consider him for placement. CSW spoke with Paula Compton at [facility] who reports DON will have to review information prior to deciding if they can make a bed offer. CSW updated patient and will let patient know once [facility] has made a decision.  [place]arth[place], [phone]

## 2014-10-26 NOTE — Progress Notes (Addendum)
ANTICOAGULATION CONSULT NOTE  Pharmacy Consult for warfarin, enoxaparin Indication: atrial fibrillation  No Known Allergies  Patient Measurements: Height: 5\' 11"  (180.3 cm) Weight: (!) 451 lb 6.4 oz (204.754 kg) IBW/kg (Calculated) : 75.3   Vital Signs: Temp: 97.8 F (36.6 C) (03/10 0549) Temp Source: Oral (03/10 0549) BP: 132/77 mmHg (03/10 0549) Pulse Rate: 78 (03/10 0549)  Labs:  Recent Labs  10/24/14 0535 10/25/14 0535 10/26/14 0530  HGB  --  14.5 14.7  HCT  --  43.7 44.8  PLT  --  225 288  LABPROT 27.3* 20.0* 18.5*  INR 2.51* 1.68* 1.53*  CREATININE 2.38* 1.59*  --     Estimated Creatinine Clearance: 98.8 mL/min (by C-G formula based on Cr of 1.59).   Medical History: Past Medical History  Diagnosis Date  . Atrial fibrillation, permanent     Failed DCCV 04/2008  . Nonischemic cardiomyopathy      Last Echo 01/28/2008, EF 20%, last cardiac cath 2009 ( Lt & Rt) with normal cors  . Morbid obesity with BMI of 60.0-69.9, adult   . Chronic anticoagulation     on coumadin  . OSA (obstructive sleep apnea)     has not had sleep study, confict with his schedule  . Hypertension     Medications:  Scheduled:  . allopurinol  300 mg Oral Daily  . carvedilol  12.5 mg Oral BID WC  . colchicine  0.6 mg Oral Daily  . digoxin  250 mcg Oral Daily  . diltiazem  240 mg Oral Daily  . doxycycline  100 mg Oral Q12H  . enoxaparin (LOVENOX) injection  200 mg Subcutaneous Q12H  . predniSONE  60 mg Oral Q breakfast  . sodium chloride  3 mL Intravenous Q12H  . warfarin  10 mg Oral Once  . Warfarin - Pharmacist Dosing Inpatient   Does not apply q1800   Infusions:  . sodium chloride 75 mL/hr at 10/25/14 1404   PRN: acetaminophen **OR** acetaminophen, HYDROmorphone (DILAUDID) injection, ondansetron **OR** ondansetron (ZOFRAN) IV, oxyCODONE  Assessment: 54 y/o M with h/o nonischemic cardiomyopathy (EF 55% on 01/02/2014), on warfarin for atrial fibrillation, admitted with  bilateral severe ankle pain and swelling that prevent ambulation. Ankle X-rays were negative for acute bony abnormalities. Orders received to continue warfarin with pharmacy dosing assistance as inpatient.      INR therapeutic (2.61) on admission.   Interviewed patient to confirm home warfarin regimen.  He reports dosage is 5 mg daily except 10 mg on Tuesdays and Saturdays, but that he did not take 3/6 dose, so was last taken on 3/5.  Today, 10/26/2014:  INR subtherapeutic, still falling.  Warfarin dosage was increased yesterday (see MAR for doses administered this admission).     Drug interactions with warfarin:   doxycycline - may reduce warfarin requirements  allopurinol - may reduce warfarin requiremens  Diet: Heart Healthy  Patient notes he is changing his diet in hopes of losing weight.  Has been eating more green leafy vegetables (had broccoli at two meals yesterday). Anticipate warfarin requirements may increase as a result of this dietary change.  Encouraged him to continue a healthy diet with guidance from his physician.  H/H, Pltc WNL   No bleeding issues reported  Discussed subtherapeutic INR with Dr. Isidoro Donning.  She gave orders to begin full-dose Lovenox until INR>=2.0   Goal Range:  INR 2-3   Plan:  1. Increase today's warfarin dose to 10 mg x 1, give this AM. 2. Lovenox 200  mg ( /kg) SQ q12h until INR>=2.0.  Will consider checking anti-Xa level unless therapy is brief. 3. PT/INR daily. 4. Anticipate higher ongoing warfarin dosage requirements if patient maintains current healthier diet. 5. But also watch for potential effects of doxycycline and allopurinol on INR 6. Follow clinical course.  Elie Goody, PharmD, BCPS Pager: [phone] 10/26/2014  7:37 AM

## 2014-10-26 NOTE — Progress Notes (Signed)
Physical Therapy Treatment Patient Details Name: Travis Bautista MRN: [id number] DOB: 1961/01/10 Today's Date: 10/26/2014    History of Present Illness 54 yo male adm with bil ankle pain; PMHx: morbid obesity, OSA, HTN    PT Comments    Have been unable to see pt past few days due to pain level B ankles.  RN premedicated prior to session.  Pt was able to self rise/roll and get to EOB with increased time(nearly 12 min) and use of rail. Pt was able to sit EOB Indep with B feet on floor with min pain.  Pt performed marching and B LE TE's to prepare self for standing.  Performed sit to stand with difficulty tolerating WBing thru B LE limited time just enough to take a few side steps to recliner. Pt was unable to tolerate advancing gait beyond that.    Follow Up Recommendations  SNF (spouse is unable to safely assist pt at home and would like to pursue SNF)     Equipment Recommendations       Recommendations for Other Services       Precautions / Restrictions Precautions Precautions: Fall    Mobility  Bed Mobility Overal bed mobility: Needs Assistance Bed Mobility: Supine to Sit     Supine to sit: Supervision     General bed mobility comments: pt able to self roll and transfer to EOB with use of rail   Transfers Overall transfer level: Needs assistance Equipment used: 4-wheeled walker Transfers: Sit to/from Stand           General transfer comment: increased, increased   Ambulation/Gait         Gait velocity: pt non amb at this time       Information systems manager Rankin (Stroke Patients Only)       Balance                                    Cognition Arousal/Alertness: Awake/alert Behavior During Therapy: WFL for tasks assessed/performed Overall Cognitive Status: Within Functional Limits for tasks assessed                      Exercises      General Comments        Pertinent Vitals/Pain  Pain Assessment: 0-10 Pain Score: 5  Pain Location: R ankle 5/10/L ankle 6/10 Pain Descriptors / Indicators: Shooting;Sharp Pain Intervention(s): Monitored during session;Premedicated before session;Repositioned    Home Living                      Prior Function            PT Goals (current goals can now be found in the care plan section) Progress towards PT goals: Progressing toward goals    Frequency  Min 3X/week    PT Plan      Co-evaluation             End of Session Equipment Utilized During Treatment: Gait belt Activity Tolerance: Patient limited by pain Patient left: in chair;with call bell/phone within reach;with family/visitor present     Time: 5643-3295 PT Time Calculation (min) (ACUTE ONLY): 35 min  Charges:  $Therapeutic Activity: 23-37 mins  G Codes:      Rica Koyanagi  PTA [facility]  Acute  Rehab Pager      [phone]

## 2014-10-26 NOTE — Progress Notes (Signed)
Clinical Social Work  CSW and CM met with patient and wife to discuss DC plans. Patient reports if he is unable to walk then he would want to go to SNF at DC. CSW explained no medical need and due to patient refusing PT on 3/9 and 3/8 and PT evaluation recommending [place] [facility] on 3/7 that insurance would most likely not cover SNF placement. Patient reports he will work with PT today in order to figure out his baseline and if he is strong enough to return home. Wife reports that she is unable to provide much assistance but understanding that insurance will base coverage on recommendations provided. CSW and CM to continue to follow patient in order to assist with DC planning.  [place], [place] [phone]

## 2014-10-26 NOTE — Discharge Instructions (Signed)
Hidradenitis Suppurativa, Sweat Gland Abscess Hidradenitis suppurativa is a long lasting (chronic), uncommon disease of the sweat glands. With this, boil-like lumps and scarring develop in the groin, some times under the arms (axillae), and under the breasts. It may also uncommonly occur behind the ears, in the crease of the buttocks, and around the genitals.  CAUSES  The cause is from a blocking of the sweat glands. They then become infected. It may cause drainage and odor. It is not contagious. So it cannot be given to someone else. It most often shows up in puberty (about 10 to 52 years of age). But it may happen much later. It is similar to acne which is a disease of the sweat glands. This condition is slightly more common in African-Americans and women. SYMPTOMS   Hidradenitis usually starts as one or more red, tender, swellings in the groin or under the arms (axilla).  Over a period of hours to days the lesions get larger. They often open to the skin surface, draining clear to yellow-colored fluid.  The infected area heals with scarring. DIAGNOSIS  Your caregiver makes this diagnosis by looking at you. Sometimes cultures (growing germs on plates in the lab) may be taken. This is to see what germ (bacterium) is causing the infection.  TREATMENT   Topical germ killing medicine applied to the skin (antibiotics) are the treatment of choice. Antibiotics taken by mouth (systemic) are sometimes needed when the condition is getting worse or is severe.  Avoid tight-fitting clothing which traps moisture in.  Dirt does not cause hidradenitis and it is not caused by poor hygiene.  Involved areas should be cleaned daily using an antibacterial soap. Some patients find that the liquid form of Lever 2000, applied to the involved areas as a lotion after bathing, can help reduce the odor related to this condition.  Sometimes surgery is needed to drain infected areas or remove scarred tissue. Removal of  large amounts of tissue is used only in severe cases.  Birth control pills may be helpful.  Oral retinoids (vitamin A derivatives) for 6 to 12 months which are effective for acne may also help this condition.  Weight loss will improve but not cure hidradenitis. It is made worse by being overweight. But the condition is not caused by being overweight.  This condition is more common in people who have had acne.  It may become worse under stress. There is no medical cure for hidradenitis. It can be controlled, but not cured. The condition usually continues for years with periods of getting worse and getting better (remission). Document Released: 03/18/2004 Document Revised: 10/27/2011 Document Reviewed: 11/04/2013 ExitCare Patient Information 2015 [employer]. This information is not intended to replace advice given to you by your health care provider. Make sure you discuss any questions you have with your health care provider.  

## 2014-10-26 NOTE — Progress Notes (Signed)
Patient ID: Travis Bautista, male   DOB: 08-May-1961, 54 y.o.   MRN: [id number]    Subjective: Pt feels well today.  No new complaints  Objective: Vital signs in last 24 hours: Temp:  [97.8 F (36.6 C)-98.8 F (37.1 C)] 97.8 F (36.6 C) (03/10 0549) Pulse Rate:  [72-86] 78 (03/10 0549) Resp:  [18-20] 20 (03/09 2209) BP: (112-132)/(56-77) 132/77 mmHg (03/10 0549) SpO2:  [93 %-97 %] 97 % (03/10 0549) Weight:  [451 lb 6.4 oz (204.754 kg)] 451 lb 6.4 oz (204.754 kg) (03/10 0549) Last BM Date: 10/25/14  Intake/Output from previous day: 03/09 0701 - 03/10 0700 In: 840 [P.O.:240; I.V.:600] Out: 5725 [Urine:5725] Intake/Output this shift:    PE: Skin: no significant change in hidradenitis today.  Maybe slightly less drainage  Lab Results:   Recent Labs  10/25/14 0535 10/26/14 0530  WBC 6.8 7.2  HGB 14.5 14.7  HCT 43.7 44.8  PLT 225 288   BMET  Recent Labs  10/25/14 0535 10/26/14 0530  NA 134* 140  K 4.6 4.4  CL 93* 99  CO2 32 31  GLUCOSE 99 113*  BUN 33* 21  CREATININE 1.59* 0.98  CALCIUM 9.0 9.5   PT/INR  Recent Labs  10/25/14 0535 10/26/14 0530  LABPROT 20.0* 18.5*  INR 1.68* 1.53*   CMP     Component Value Date/Time   NA 140 10/26/2014 0530   K 4.4 10/26/2014 0530   CL 99 10/26/2014 0530   CO2 31 10/26/2014 0530   GLUCOSE 113* 10/26/2014 0530   BUN 21 10/26/2014 0530   CREATININE 0.98 10/26/2014 0530   CALCIUM 9.5 10/26/2014 0530   PROT 8.1 10/23/2014 0015   ALBUMIN 3.8 10/23/2014 0015   AST 30 10/23/2014 0015   ALT 22 10/23/2014 0015   ALKPHOS 53 10/23/2014 0015   BILITOT 0.9 10/23/2014 0015   GFRNONAA >90 10/26/2014 0530   GFRAA >90 10/26/2014 0530   Lipase  No results found for: LIPASE     Studies/Results: No results found.  Anti-infectives: Anti-infectives    Start     Dose/Rate Route Frequency Ordered Stop   10/25/14 1700  doxycycline (VIBRA-TABS) tablet 100 mg     100 mg Oral Every 12 hours 10/25/14 1543          Assessment/Plan  1. Hidradenitis suppurativa of thighs  -cont doxycycline.  Would treat for 3 weeks to dry this drainage up. -he can follow up with [place] in our office or with plastic surgery for further more long-term management of this problem.  -no surgical indications at this time.  We will sign off.   LOS: 3 days    Kennan Detter E 10/26/2014, 9:03 AM Pager: [phone]

## 2014-10-27 LAB — BASIC METABOLIC PANEL
Anion gap: 14 (ref 5–15)
BUN: 29 mg/dL — AB (ref 6–23)
CHLORIDE: 98 mmol/L (ref 96–112)
CO2: 27 mmol/L (ref 19–32)
CREATININE: 1.03 mg/dL (ref 0.50–1.35)
Calcium: 10 mg/dL (ref 8.4–10.5)
GFR calc non Af Amer: 82 mL/min — ABNORMAL LOW (ref >90)
Glucose, Bld: 105 mg/dL — ABNORMAL HIGH (ref 70–99)
POTASSIUM: 3.6 mmol/L (ref 3.5–5.1)
Sodium: 139 mmol/L (ref 135–145)

## 2014-10-27 LAB — CBC
HCT: 46.8 % (ref 39.0–52.0)
HEMOGLOBIN: 15 g/dL (ref 13.0–17.0)
MCH: 31.8 pg (ref 26.0–34.0)
MCHC: 32.1 g/dL (ref 30.0–36.0)
MCV: 99.4 fL (ref 78.0–100.0)
Platelets: 310 10*3/uL (ref 150–400)
RBC: 4.71 MIL/uL (ref 4.22–5.81)
RDW: 13.8 % (ref 11.5–15.5)
WBC: 8.7 10*3/uL (ref 4.0–10.5)

## 2014-10-27 LAB — PROTIME-INR
INR: 2.03 — AB (ref 0.00–1.49)
PROTHROMBIN TIME: 23.1 s — AB (ref 11.6–15.2)

## 2014-10-27 MED ORDER — COLCHICINE 0.6 MG PO TABS: 0.6000 mg | ORAL_TABLET | Freq: Every day | ORAL | Status: DC

## 2014-10-27 MED ORDER — PREDNISONE 10 MG PO TABS: ORAL_TABLET | ORAL | Status: DC

## 2014-10-27 MED ORDER — DOXYCYCLINE HYCLATE 100 MG PO TABS: 100.0000 mg | ORAL_TABLET | Freq: Two times a day (BID) | ORAL | Status: DC

## 2014-10-27 MED ORDER — WARFARIN SODIUM 7.5 MG PO TABS
7.5000 mg | ORAL_TABLET | Freq: Once | ORAL | Status: AC
  Administered 2014-10-27: 7.5 mg via ORAL
  Filled 2014-10-27: qty 1

## 2014-10-27 MED ORDER — OXYCODONE HCL 5 MG PO TABS: 5.0000 mg | ORAL_TABLET | Freq: Four times a day (QID) | ORAL | Status: DC | PRN

## 2014-10-27 MED ORDER — ALLOPURINOL 300 MG PO TABS: 300.0000 mg | ORAL_TABLET | Freq: Every day | ORAL | Status: AC

## 2014-10-27 MED ORDER — PREDNISONE 50 MG PO TABS: 60.0000 mg | ORAL_TABLET | Freq: Once | ORAL | Status: DC

## 2014-10-27 NOTE — Progress Notes (Addendum)
CARE MANAGEMENT NOTE 10/27/2014  Patient:  Travis Bautista, Travis Bautista   Account Number:  [id number]  Date Initiated:  10/23/2014  Documentation initiated by:  Ferdinand Cava  Subjective/Objective Assessment:   54 yo male admitted with bilateral ankle pain from home     Action/Plan:   discharge planning   Anticipated DC Date:  10/24/2014   Anticipated DC Plan:    In-house referral  Clinical Social Worker      DC Planning Services  CM consult      PAC Choice  DURABLE MEDICAL EQUIPMENT  HOME HEALTH   Choice offered to / List presented to:  [name]   DME arranged  BEDSIDE COMMODE  HOSPITAL BED  SHOWER STOOL  WALKER - ROLLING  WHEELCHAIR - MANUAL      DME agency  [facility]     HH arranged  HH-1 RN  HH-10 DISEASE MANAGEMENT  HH-2 PT  HH-3 OT  HH-4 NURSE'S AIDE      HH agency  [facility]   Status of service:  Completed, signed off Medicare Important Message given?   (If response is "NO", the following Medicare IM given date fields will be blank) Date Medicare IM given:   Medicare IM given by:   Date Additional Medicare IM given:   Additional Medicare IM given by:    Discharge Disposition:    Per UR Regulation:    If discussed at Long Length of Stay Meetings, dates discussed:    Comments: 10/27/14 Sue Lush CM [phone] Explained to patient that wheelchair has to be ordered and will be delivered but all other DME will be delivered to home today once [facility] is able to contact spouse to schedule a delivery time. Once DME is delivered then patient will be transported home via [facility].    10/27/14 Ferdinand Cava RN BSN CM [phone] Spoke with patient regarding options for DME in the home and [facility] services available and explained services. Patient and spouse reviewed SNF availabilities for rehab and decided on [facility] with DME. Patient chose [facility] for [facility] services and DME. Referral communicated to [facility] for [facility] services and Mayra Reel for DME in home. Patient will be  transported home vias [facility] once all DME delivered. Await estimated delivery time for CSW to coordinate transportation services home.  10/23/14 Ferdinand Cava RN BSN CM [phone] Patient lives at home with spouse and son and has no DME. He stated that he has needed DME and since he has developed the ankle pain his son has been helping him get out of bed. He has a PCP, cardiologist, and pharmacy. He stated that he prefers to go to a facility for rehab before returning home. Made CSW aware. Awaiting PT recommendations.

## 2014-10-27 NOTE — Progress Notes (Signed)
OT Cancellation Note  Patient Details Name: Travis Bautista MRN: [id number] DOB: [birth year]   Cancelled Treatment:    Reason Eval/Treat Not Completed: Other (comment).  Checked on pt an hour ago and he asked me to return later as he needed to call SW.  See that pt plans to discharge home. Will stop back ASAP to assess for DME and follow up needs.    [name] 10/27/2014, 12:57 PM  Marica Otter, OTR/L [phone] 10/27/2014

## 2014-10-27 NOTE — Evaluation (Signed)
Occupational Therapy Evaluation Patient Details Name: Travis Bautista MRN: [id number] DOB: 1961/04/01 Today's Date: 10/27/2014    History of Present Illness 54 yo male adm with bil ankle pain; PMHx: morbid obesity, OSA, HTN   Clinical Impression   Pt was admitted for the above.  He was seen at bed level only as he did not want to get OOB at this time. Pt reports he has been able to SPT and perform bed mobility with supervision.  Educated on AE and shower transfer, when he is ready.  He verbalizes understanding.  No further OT needs at this time as he feels his wife and 60 year old son can stand by. He doesn't want to do anything where they have to assist with lifting.    Follow Up Recommendations  No OT follow up    Equipment Recommendations  3 in 1 bedside comode (wide)    Recommendations for Other Services       Precautions / Restrictions Precautions Precautions: Fall Restrictions Weight Bearing Restrictions: No      Mobility Bed Mobility                  Transfers                      Balance                                            ADL Overall ADL's : Needs assistance/impaired                                       General ADL Comments: Pt was seen only for bed level evaluation:  he reports that he has been able to get OOB without assistance and has transferred to chair and BSC with someone standing by.  Educated on DME/AE.  He has a 3:1 commode ordered as welll as shower seat.  Educated that he could have someone move 3:1 into shower to double as a shower seat.  Someone would need to wipe pins on legs off to avoid rusting.  Pt has been using frame of shower door to get into shower.  Educated on sequence for using walker instead, which I recommend for increased leverage through UEs. Educated on Sales promotion account executive.  He does have a reacher at home that used to belong to his mother-in-law.  He is interested in getting  toilet aide, which is available in gift shop.  He already has a long sponge.       Vision     Perception     Praxis      Pertinent Vitals/Pain Pain Assessment: No/denies pain (when supine)     Hand Dominance     Extremity/Trunk Assessment Upper Extremity Assessment Upper Extremity Assessment: Overall WFL for tasks assessed           Communication Communication Communication: No difficulties   Cognition Arousal/Alertness: Awake/alert Behavior During Therapy: WFL for tasks assessed/performed Overall Cognitive Status: Within Functional Limits for tasks assessed                     General Comments       Exercises       Shoulder Instructions      Home Living Family/patient expects to  be discharged to:: Private residence Living Arrangements: Spouse/significant other;Children Available Help at Discharge: Family               Bathroom Shower/Tub: Walk-in Human resources officer: Handicapped height                Prior Functioning/Environment Level of Independence: Independent             OT Diagnosis: Acute pain   OT Problem List:     OT Treatment/Interventions:      OT Goals(Current goals can be found in the care plan section) Acute Rehab OT Goals Patient Stated Goal: to get back to normal, decr pain  OT Frequency:     Barriers to D/C:            Co-evaluation              End of Session    Activity Tolerance:   Patient left: in bed;with call bell/phone within reach   Time: 1323-1340 OT Time Calculation (min): 17 min Charges:  OT General Charges $OT Visit: 1 Procedure G-Codes:    Cannon Quinton 11-04-14, 1:58 PM  Marica Otter, OTR/L [phone] November 04, 2014

## 2014-10-27 NOTE — Progress Notes (Signed)
Clinical Social Work  CSW verified address and completed PTAR forms which were placed on chart. RN to call to arrange PTAR once equipment is delivered to home.   Non-emergency ambulance #  Prior to 5pm: [phone] After 5pm: [phone] opt. 3  CSW is signing off but available if needed.  [place], [place] [phone]

## 2014-10-27 NOTE — Progress Notes (Signed)
ANTICOAGULATION CONSULT NOTE  Pharmacy Consult for warfarin, enoxaparin Indication: atrial fibrillation  No Known Allergies  Patient Measurements: Height: 5\' 11"  (180.3 cm) Weight: (!) 451 lb 6.4 oz (204.754 kg) IBW/kg (Calculated) : 75.3   Vital Signs: Temp: 97.8 F (36.6 C) (03/11 0500) Temp Source: Oral (03/11 0500) BP: 124/72 mmHg (03/11 0500) Pulse Rate: 68 (03/11 0500)  Labs:  Recent Labs  10/25/14 0535 10/26/14 0530 10/27/14 0558  HGB 14.5 14.7 15.0  HCT 43.7 44.8 46.8  PLT 225 288 310  LABPROT 20.0* 18.5* 23.1*  INR 1.68* 1.53* 2.03*  CREATININE 1.59* 0.98 1.03    Estimated Creatinine Clearance: 152.5 mL/min (by C-G formula based on Cr of 1.03).   Medical History: Past Medical History  Diagnosis Date  . Atrial fibrillation, permanent     Failed DCCV 04/2008  . Nonischemic cardiomyopathy      Last Echo 01/28/2008, EF 20%, last cardiac cath 2009 ( Lt & Rt) with normal cors  . Morbid obesity with BMI of 60.0-69.9, adult   . Chronic anticoagulation     on coumadin  . OSA (obstructive sleep apnea)     has not had sleep study, confict with his schedule  . Hypertension     Medications:  Scheduled:  . allopurinol  300 mg Oral Daily  . carvedilol  12.5 mg Oral BID WC  . colchicine  0.6 mg Oral Daily  . digoxin  250 mcg Oral Daily  . diltiazem  240 mg Oral Daily  . doxycycline  100 mg Oral Q12H  . predniSONE  60 mg Oral Q breakfast  . sodium chloride  3 mL Intravenous Q12H  . Warfarin - Pharmacist Dosing Inpatient   Does not apply q1800   Infusions:    PRN: acetaminophen **OR** acetaminophen, HYDROmorphone (DILAUDID) injection, ondansetron **OR** ondansetron (ZOFRAN) IV, oxyCODONE  Assessment: 54 y/o M with h/o nonischemic cardiomyopathy (EF 55% on 01/02/2014), on warfarin for atrial fibrillation, admitted with bilateral severe ankle pain and swelling that prevent ambulation. Ankle X-rays were negative for acute bony abnormalities. Orders received to  continue warfarin with pharmacy dosing assistance as inpatient.      INR therapeutic (2.61) on admission.   Interviewed patient to confirm home warfarin regimen.  He reports dosage is 5 mg daily except 10 mg on Tuesdays and Saturdays, but that he did not take 3/6 dose, so was last taken on 3/5.  Today, 10/27/2014:  INR therapeutic at 2.03    Drug interactions with warfarin:   doxycycline - may reduce warfarin requirements  allopurinol - may reduce warfarin requiremens  Diet: Heart Healthy  Note patient has changed his diet in hopes of losing weight.  Has been eating more green leafy vegetables. Anticipate warfarin requirements may increase as a result of this dietary change.    H/H, Pltc WNL   No bleeding issues reported   Goal Range:  INR 2-3   Plan:   Stop enoxaparin as INR is therapeutic  Warfarin 7.5mg  today x 1 @ 1200, anticipate discharge today  PT/INR daily   When patient goes to SNF will need close INR monitoring.  Suggest checking INR at least twice a week until therapeutic and stable, then at least once a week until discharged from SNF and follow-up care is resumed at anticoagulation clinic.  Anticipate higher ongoing warfarin dosage requirements if patient maintains current healthier diet.  Also watch for potential effects of doxycycline and allopurinol on INR  Follow clinical course    Arley Phenix RPh  10/27/2014, 8:36 AM Pager [phone]

## 2014-10-27 NOTE — Progress Notes (Signed)
Clinical Social Work  CSW spoke with MD who reports patient will DC today. CSW spoke with [facility] who can offer a bed but would be unable to get authorization today and unable to take a LOG until Josem Kaufmann is provided. CSW spoke with [employer] who is hopeful they could receive auth by 2pm today. CSW met with patient at bedside and explained DC plans. CSW explained that [facility] would not be an option at this time but if he was interested in placement then [employer] could work on authorization. Patient reports he has been feeling better and might prefer to return home. Patient's wife was involved with conversation and patient and wife want to go home with [facility] services. CM aware and will assist with [facility] and equipment needs. CSW will arrange PTAR for DC once HH is arranged.  [place], [place] [phone]
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
## 2014-10-28 LAB — WOUND CULTURE: Special Requests: NORMAL

## 2014-10-30 ENCOUNTER — Telehealth: Payer: Self-pay | Admitting: Pharmacist Clinician (PhC)/ Clinical Pharmacy Specialist

## 2014-10-30 NOTE — Telephone Encounter (Signed)
Spoke with patient, has been at [facility] for episode of gout and Hidradenitis suppurativa was d/c on doxycycline 100 mg bid and now has HH services.  Was given varying doses of warfarin in hospital (5-10mg  daily).  Advised pt to continue with 7.5 mg daily, will have HH check INR level on Friday.  Spoke with HH, orders placed

## 2014-11-01 ENCOUNTER — Telehealth: Payer: Self-pay | Admitting: *Deleted

## 2014-11-01 NOTE — Telephone Encounter (Signed)
Faxed orders to [facility] r/t PT/INR

## 2014-11-03 ENCOUNTER — Ambulatory Visit (INDEPENDENT_AMBULATORY_CARE_PROVIDER_SITE_OTHER): Payer: BLUE CROSS/BLUE SHIELD | Admitting: Pharmacist Clinician (PhC)/ Clinical Pharmacy Specialist

## 2014-11-03 LAB — POCT INR: INR: 3.7

## 2014-11-08 ENCOUNTER — Other Ambulatory Visit: Payer: Self-pay | Admitting: Internal Medicine

## 2014-11-08 ENCOUNTER — Other Ambulatory Visit: Payer: Self-pay | Admitting: Cardiovascular Disease

## 2014-11-08 NOTE — Telephone Encounter (Signed)
Rx(s) sent to pharmacy electronically. Staff message sent to scheduling pool to contact patient for appointment

## 2014-11-09 ENCOUNTER — Encounter: Payer: Self-pay | Admitting: Internal Medicine

## 2014-11-10 ENCOUNTER — Ambulatory Visit (INDEPENDENT_AMBULATORY_CARE_PROVIDER_SITE_OTHER): Payer: BLUE CROSS/BLUE SHIELD | Admitting: Pharmacist Clinician (PhC)/ Clinical Pharmacy Specialist

## 2014-11-10 LAB — POCT INR: INR: 2.1

## 2014-11-16 ENCOUNTER — Other Ambulatory Visit: Payer: Self-pay | Admitting: Internal Medicine

## 2014-11-16 ENCOUNTER — Telehealth: Payer: Self-pay | Admitting: *Deleted

## 2014-11-16 NOTE — Telephone Encounter (Signed)
Faxed order for PT/INR to [facility]

## 2014-11-21 ENCOUNTER — Telehealth: Payer: Self-pay | Admitting: Internal Medicine

## 2014-11-21 LAB — PROTIME-INR

## 2014-11-21 NOTE — Telephone Encounter (Signed)
Pt is followed by [place] in coumadin clinic.  PCP office called, they checked pt INR today. It was 4.3  They instructed him to hold dose tonight, faxing report to [place].  I will route to [facility] [place] [facility] for additional dosing directions for patient.

## 2014-11-22 ENCOUNTER — Ambulatory Visit (INDEPENDENT_AMBULATORY_CARE_PROVIDER_SITE_OTHER): Payer: BLUE CROSS/BLUE SHIELD | Admitting: Pharmacist Clinician (PhC)/ Clinical Pharmacy Specialist

## 2014-11-22 LAB — POCT INR: INR: 4.3

## 2014-11-22 NOTE — Telephone Encounter (Signed)
See anticoag note

## 2014-11-27 ENCOUNTER — Encounter: Payer: Self-pay | Admitting: Internal Medicine

## 2014-11-27 ENCOUNTER — Telehealth: Payer: Self-pay | Admitting: Internal Medicine

## 2014-11-27 NOTE — Telephone Encounter (Signed)
Received records from [facility] for appointment on 12/11/14 with Dr Rennis Golden.  Records given to [facility] (medical records) for Dr Blanchie Dessert schedule on 12/11/14. lp

## 2014-12-05 ENCOUNTER — Other Ambulatory Visit: Payer: Self-pay | Admitting: Internal Medicine

## 2014-12-11 ENCOUNTER — Ambulatory Visit: Payer: BLUE CROSS/BLUE SHIELD | Admitting: Pharmacist Clinician (PhC)/ Clinical Pharmacy Specialist

## 2014-12-11 ENCOUNTER — Ambulatory Visit: Payer: BLUE CROSS/BLUE SHIELD | Admitting: Internal Medicine

## 2014-12-19 ENCOUNTER — Telehealth: Payer: Self-pay | Admitting: Internal Medicine

## 2014-12-19 ENCOUNTER — Ambulatory Visit (INDEPENDENT_AMBULATORY_CARE_PROVIDER_SITE_OTHER): Payer: BLUE CROSS/BLUE SHIELD | Admitting: Pharmacist Clinician (PhC)/ Clinical Pharmacy Specialist

## 2014-12-19 LAB — POCT INR: INR: 4.7

## 2014-12-19 NOTE — Telephone Encounter (Signed)
Returned call to patient he stated JC already called him.Already taken care of.

## 2014-12-19 NOTE — Telephone Encounter (Signed)
Pt. Given Instructions from Dr. Rennis Golden

## 2014-12-19 NOTE — Telephone Encounter (Signed)
Please call,have some question regarding his medication.

## 2014-12-20 ENCOUNTER — Telehealth: Payer: Self-pay | Admitting: Internal Medicine

## 2014-12-20 NOTE — Telephone Encounter (Signed)
Received records from [facility] for appointment on 01/17/15 with Dr Rennis Golden.  Records given to [facility] (medical records) for Dr Blanchie Dessert schedule on 01/17/15. lp

## 2014-12-21 ENCOUNTER — Encounter: Payer: Self-pay | Admitting: Internal Medicine

## 2014-12-28 ENCOUNTER — Other Ambulatory Visit: Payer: Self-pay | Admitting: Internal Medicine

## 2014-12-28 ENCOUNTER — Other Ambulatory Visit: Payer: Self-pay | Admitting: *Deleted

## 2015-01-17 ENCOUNTER — Ambulatory Visit (INDEPENDENT_AMBULATORY_CARE_PROVIDER_SITE_OTHER): Payer: BLUE CROSS/BLUE SHIELD | Admitting: Pharmacist Clinician (PhC)/ Clinical Pharmacy Specialist

## 2015-01-17 ENCOUNTER — Ambulatory Visit (INDEPENDENT_AMBULATORY_CARE_PROVIDER_SITE_OTHER): Payer: BLUE CROSS/BLUE SHIELD | Admitting: Internal Medicine

## 2015-01-17 ENCOUNTER — Encounter: Payer: Self-pay | Admitting: Internal Medicine

## 2015-01-17 VITALS — BP 126/86 | HR 81 | Ht 71.5 in | Wt >= 6400 oz

## 2015-01-17 DIAGNOSIS — I10 Essential (primary) hypertension: Secondary | ICD-10-CM

## 2015-01-17 DIAGNOSIS — I4891 Unspecified atrial fibrillation: Secondary | ICD-10-CM

## 2015-01-17 DIAGNOSIS — I428 Other cardiomyopathies: Secondary | ICD-10-CM

## 2015-01-17 DIAGNOSIS — Z7901 Long term (current) use of anticoagulants: Secondary | ICD-10-CM

## 2015-01-17 DIAGNOSIS — I429 Cardiomyopathy, unspecified: Secondary | ICD-10-CM | POA: Diagnosis not present

## 2015-01-17 DIAGNOSIS — I481 Persistent atrial fibrillation: Secondary | ICD-10-CM

## 2015-01-17 DIAGNOSIS — I4819 Other persistent atrial fibrillation: Secondary | ICD-10-CM

## 2015-01-17 LAB — POCT INR: INR: 1.6
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
## 2015-01-17 NOTE — Patient Instructions (Signed)
Your physician has recommended you make the following change in your medication: STOP digoxin   Your physician wants you to follow-up in: 6 months with Dr. Hilty. You will receive a reminder letter in the mail two months in advance. If you don't receive a letter, please call our office to schedule the follow-up appointment.   

## 2015-01-26 ENCOUNTER — Other Ambulatory Visit: Payer: Self-pay | Admitting: Internal Medicine

## 2015-01-31 ENCOUNTER — Ambulatory Visit: Payer: BLUE CROSS/BLUE SHIELD | Admitting: Pharmacist Clinician (PhC)/ Clinical Pharmacy Specialist

## 2015-02-06 ENCOUNTER — Ambulatory Visit: Payer: BLUE CROSS/BLUE SHIELD | Admitting: Pharmacist Clinician (PhC)/ Clinical Pharmacy Specialist

## 2015-02-07 ENCOUNTER — Ambulatory Visit: Payer: BLUE CROSS/BLUE SHIELD | Admitting: Pharmacist Clinician (PhC)/ Clinical Pharmacy Specialist

## 2015-02-07 ENCOUNTER — Other Ambulatory Visit: Payer: Self-pay | Admitting: Internal Medicine

## 2015-02-09 ENCOUNTER — Ambulatory Visit (INDEPENDENT_AMBULATORY_CARE_PROVIDER_SITE_OTHER): Payer: BLUE CROSS/BLUE SHIELD | Admitting: Pharmacist

## 2015-02-09 DIAGNOSIS — I481 Persistent atrial fibrillation: Secondary | ICD-10-CM | POA: Diagnosis not present

## 2015-02-09 DIAGNOSIS — I4819 Other persistent atrial fibrillation: Secondary | ICD-10-CM

## 2015-02-09 LAB — POCT INR: INR: 2.1

## 2015-04-02 ENCOUNTER — Other Ambulatory Visit: Payer: Self-pay | Admitting: Internal Medicine

## 2015-04-02 NOTE — Telephone Encounter (Signed)
Rx(s) sent to pharmacy electronically.  

## 2015-04-04 ENCOUNTER — Other Ambulatory Visit: Payer: Self-pay | Admitting: Internal Medicine

## 2015-04-21 ENCOUNTER — Other Ambulatory Visit: Payer: Self-pay | Admitting: Internal Medicine

## 2015-05-03 ENCOUNTER — Ambulatory Visit: Payer: BLUE CROSS/BLUE SHIELD | Admitting: Pharmacist Clinician (PhC)/ Clinical Pharmacy Specialist

## 2015-05-03 ENCOUNTER — Other Ambulatory Visit: Payer: Self-pay | Admitting: Internal Medicine

## 2015-05-07 ENCOUNTER — Ambulatory Visit: Payer: BLUE CROSS/BLUE SHIELD | Admitting: Pharmacist Clinician (PhC)/ Clinical Pharmacy Specialist

## 2015-05-11 ENCOUNTER — Other Ambulatory Visit: Payer: Self-pay | Admitting: Surgery

## 2015-05-31 ENCOUNTER — Other Ambulatory Visit (HOSPITAL_COMMUNITY): Payer: Self-pay | Admitting: General Surgery

## 2015-06-03 ENCOUNTER — Other Ambulatory Visit: Payer: Self-pay | Admitting: Internal Medicine

## 2015-06-07 ENCOUNTER — Ambulatory Visit (INDEPENDENT_AMBULATORY_CARE_PROVIDER_SITE_OTHER): Payer: BLUE CROSS/BLUE SHIELD | Admitting: Pharmacist Clinician (PhC)/ Clinical Pharmacy Specialist

## 2015-06-07 DIAGNOSIS — I4891 Unspecified atrial fibrillation: Secondary | ICD-10-CM | POA: Diagnosis not present

## 2015-06-07 DIAGNOSIS — I481 Persistent atrial fibrillation: Secondary | ICD-10-CM

## 2015-06-07 DIAGNOSIS — I4819 Other persistent atrial fibrillation: Secondary | ICD-10-CM

## 2015-06-07 LAB — POCT INR: INR: 1.9

## 2015-06-08 ENCOUNTER — Ambulatory Visit (HOSPITAL_COMMUNITY)
Admission: RE | Admit: 2015-06-08 | Discharge: 2015-06-08 | Disposition: A | Discharge status: Home / Self Care | Payer: BLUE CROSS/BLUE SHIELD | Source: Ambulatory Visit | Attending: General Surgery | Admitting: General Surgery

## 2015-06-08 ENCOUNTER — Other Ambulatory Visit: Payer: Self-pay

## 2015-06-08 ENCOUNTER — Other Ambulatory Visit (HOSPITAL_COMMUNITY): Payer: BLUE CROSS/BLUE SHIELD

## 2015-06-08 DIAGNOSIS — Z01818 Encounter for other preprocedural examination: Secondary | ICD-10-CM | POA: Insufficient documentation

## 2015-06-08 DIAGNOSIS — I517 Cardiomegaly: Secondary | ICD-10-CM | POA: Insufficient documentation

## 2015-06-21 ENCOUNTER — Encounter: Payer: BLUE CROSS/BLUE SHIELD | Attending: General Surgery | Admitting: Dietician

## 2015-06-21 ENCOUNTER — Encounter: Payer: Self-pay | Admitting: Dietician

## 2015-06-21 DIAGNOSIS — Z713 Dietary counseling and surveillance: Secondary | ICD-10-CM | POA: Diagnosis not present

## 2015-06-21 DIAGNOSIS — Z6841 Body Mass Index (BMI) 40.0 and over, adult: Secondary | ICD-10-CM | POA: Insufficient documentation

## 2015-06-21 NOTE — Patient Instructions (Addendum)
Follow Pre-Op Goals Try Protein Shakes Attend in Pre-Op Class  Things to remember:  Please always be honest with [place]. We want to support you!  If you have any questions or concerns in between appointments, please call or email Jennette Banker, or Jacki Cones.  The diet after surgery will be high protein and low in carbohydrate.  Vitamins and calcium need to be taken for the rest of your life.  Feel free to include support people in any classes or appointments.

## 2015-06-21 NOTE — Progress Notes (Signed)
  Pre-Op Assessment Visit:  Pre-Operative Sleeve Gastrectomy Surgery  Medical Nutrition Therapy:  Appt start time: 0850   End time:  0935.  Patient was seen on 06/21/2015 for Pre-Operative Nutrition Assessment. Assessment and letter of approval faxed to [facility] Bariatric Surgery Program coordinator on 06/21/2015.   Preferred Learning Style:   No preference indicated   Learning Readiness:   Ready  Handouts given during visit include:  Pre-Op Goals Bariatric Surgery Protein Shakes   During the appointment today the following Pre-Op Goals were reviewed with the patient: Maintain or lose weight as instructed by your surgeon Make healthy food choices Begin to limit portion sizes Limited concentrated sugars and fried foods Keep fat/sugar in the single digits per serving on   food labels Practice CHEWING your food  (aim for 30 chews per bite or until applesauce consistency) Practice not drinking 15 minutes before, during, and 30 minutes after each meal/snack Avoid all carbonated beverages  Avoid/limit caffeinated beverages  Avoid all sugar-sweetened beverages Consume 3 meals per day; eat every 3-5 hours Make a list of non-food related activities Aim for 64-100 ounces of FLUID daily  Aim for at least 60-80 grams of PROTEIN daily Look for a liquid protein source that contain ?15 g protein and ?5 g carbohydrate  (ex: shakes, drinks, shots)  Patient-Centered Goals: Goals: increase mobility, improve back pain, start traveling (spend more time with family)  9 level of confidence/10 level of importance scale 1-10  Demonstrated degree of understanding via:  Teach Back  Teaching Method Utilized:  Visual Auditory Hands on  Barriers to learning/adherence to lifestyle change: none  Patient to call the [facility] to enroll in Pre-Op and Post-Op Nutrition Education when surgery date is scheduled.

## 2015-06-26 ENCOUNTER — Ambulatory Visit (HOSPITAL_BASED_OUTPATIENT_CLINIC_OR_DEPARTMENT_OTHER): Payer: BLUE CROSS/BLUE SHIELD | Attending: General Surgery | Admitting: Radiology

## 2015-06-26 VITALS — Ht 71.0 in | Wt >= 6400 oz

## 2015-06-26 DIAGNOSIS — G4733 Obstructive sleep apnea (adult) (pediatric): Secondary | ICD-10-CM | POA: Insufficient documentation

## 2015-06-26 DIAGNOSIS — E669 Obesity, unspecified: Secondary | ICD-10-CM | POA: Diagnosis not present

## 2015-06-26 DIAGNOSIS — M549 Dorsalgia, unspecified: Secondary | ICD-10-CM | POA: Insufficient documentation

## 2015-06-26 DIAGNOSIS — G4736 Sleep related hypoventilation in conditions classified elsewhere: Secondary | ICD-10-CM | POA: Insufficient documentation

## 2015-06-26 DIAGNOSIS — R0683 Snoring: Secondary | ICD-10-CM | POA: Insufficient documentation

## 2015-06-26 DIAGNOSIS — I10 Essential (primary) hypertension: Secondary | ICD-10-CM | POA: Diagnosis not present

## 2015-06-26 DIAGNOSIS — I4891 Unspecified atrial fibrillation: Secondary | ICD-10-CM | POA: Insufficient documentation

## 2015-06-26 DIAGNOSIS — R5383 Other fatigue: Secondary | ICD-10-CM | POA: Insufficient documentation

## 2015-06-26 DIAGNOSIS — Z6841 Body Mass Index (BMI) 40.0 and over, adult: Secondary | ICD-10-CM | POA: Insufficient documentation

## 2015-06-30 DIAGNOSIS — G4733 Obstructive sleep apnea (adult) (pediatric): Secondary | ICD-10-CM | POA: Diagnosis not present

## 2015-06-30 NOTE — Progress Notes (Signed)
   Patient Name: Travis Bautista, Travis Bautista Date: 06/26/2015 Gender: Male D.O.B: 1960-11-01 Age (years): 54 Referring Provider: Gaynelle Adu Height (inches): 71 Interpreting Physician: Jetty Duhamel MD, ABSM Weight (lbs): 471 RPSGT: Shelah Lewandowsky BMI: 66 MRN: [id number] Neck Size: 22.50 CLINICAL INFORMATION Sleep Study Type: NPSG Indication for sleep study: Fatigue, Hypertension, Obesity, OSA Epworth Sleepiness Score: 5  SLEEP STUDY TECHNIQUE As per the AASM Manual for the Scoring of Sleep and Associated Events v2.3 (April 2016) with a hypopnea requiring 4% desaturations. The channels recorded and monitored were frontal, central and occipital EEG, electrooculogram (EOG), submentalis EMG (chin), nasal and oral airflow, thoracic and abdominal wall motion, anterior tibialis EMG, snore microphone, electrocardiogram, and pulse oximetry. MEDICATIONS Patient's medications include: charted for review. Medications self-administered by patient during sleep study : No sleep medicine administered.  SLEEP ARCHITECTURE The study was initiated at 10:45:55 PM and ended at 4:35:10 AM. Sleep onset time was 18.2 minutes and the sleep efficiency was 31.2%. The total sleep time was 109.0 minutes. Stage REM latency was N/A minutes. The patient spent 47.71% of the night in stage N1 sleep, 52.29% in stage N2 sleep, 0.00% in stage N3 and 0.00% in REM. Alpha intrusion was absent. Supine sleep was 50.00%. Wake after sleep onset 222 minutes  RESPIRATORY PARAMETERS The overall apnea/hypopnea index (AHI) was 39.1 per hour. There were 4 total apneas, including 4 obstructive, 0 central and 0 mixed apneas. There were 67 hypopneas and 58 RERAs. The AHI during Stage REM sleep was N/A per hour. AHI while supine was 67.2 per hour. The mean oxygen saturation was 88.24%. The minimum SpO2 during sleep was 79.00%. Moderate snoring was noted during this study.  CARDIAC DATA The 2 lead EKG demonstrated Atrial  Fibrillation. The mean heart rate was 87.07 beats per minute. Other EKG findings include: None.  LEG MOVEMENT DATA The total PLMS were 0 with a resulting PLMS index of 0.00. Associated arousal with leg movement index was 0.0 .  IMPRESSIONS - Severe obstructive sleep apnea occurred during this study (AHI = 39.1/h). - No significant central sleep apnea occurred during this study (CAI = 0.0/h). - Moderate oxygen desaturation was noted during this study (Min O2 = 79.00%). - The patient snored with Moderate snoring volume. - There were not enough early sleep or events to meet protocol requirements for split CPAP titration on this study night. -  Atrial fibrillation with controlled ventricular response rate - Clinically significant periodic limb movements did not occur during sleep. No significant associated arousals. - Sleep quality was affected by back pain and need to sit up at times during study.  DIAGNOSIS - Obstructive Sleep Apnea (327.23 [G47.33 ICD-10]) - Nocturnal Hypoxemia (327.26 [G47.36 ICD-10])  RECOMMENDATIONS - Therapeutic CPAP titration to determine optimal pressure required to alleviate sleep disordered breathing. - EKG to clarify cardiac rhythm - Positional therapy avoiding supine position during sleep. - Avoid alcohol, sedatives and other CNS depressants that may worsen sleep apnea and disrupt normal sleep architecture. - Sleep hygiene should be reviewed to assess factors that may improve sleep quality. - Weight management and regular exercise should be initiated or continued if appropriate.  Waymon Budge Diplomate, American Board of Sleep Medicine  ELECTRONICALLY SIGNED ON:  06/30/2015, 9:45 AM [facility] PH: [phone]   FX: [phone] ACCREDITED BY THE AMERICAN ACADEMY OF SLEEP MEDICINE

## 2015-07-17 NOTE — Progress Notes (Signed)
  Pre-Operative Nutrition Class:  Appt start time: 0814   End time:  1830.  Patient was seen on 07/16/15 for Pre-Operative Bariatric Surgery Education at [facility].   Surgery date:  Surgery type: Sleeve gastrectomy Start weight at [facility]: 490 lbs on 06/21/15 Weight today: 491.7 lbs  TANITA  BODY COMP RESULTS  07/16/15   BMI (kg/m^2) N/A   Fat Mass (lbs)    Fat Free Mass (lbs)    Total Body Water (lbs)    Samples given per MNT protocol. Patient educated on appropriate usage: Premier protein shake (strawberry - qty 1) Lot #: [id number] Exp: 04/2016  Unjury protein powder (chicken soup - qty 1) Lot #: [id number] Exp: 06/2016  Celebrate Vitamins Multivitamin chew (berry - qty 1) Lot #: [id number] Exp: 10/2016  PB2 (qty 1) Lot #: [id number] Exp: 05/2017  The following the learning objectives were met by the patient during this course:  Identify Pre-Op Dietary Goals and will begin 2 weeks pre-operatively  Identify appropriate sources of fluids and proteins   State protein recommendations and appropriate sources pre and post-operatively  Identify Post-Operative Dietary Goals and will follow for 2 weeks post-operatively  Identify appropriate multivitamin and calcium sources  Describe the need for physical activity post-operatively and will follow MD recommendations  State when to call healthcare provider regarding medication questions or post-operative complications  Handouts given during class include:  Pre-Op Bariatric Surgery Diet Handout  Protein Shake Handout  Post-Op Bariatric Surgery Nutrition Handout  BELT Program Information Flyer  Support Group Information Flyer  [facility] Bariatric Supplements Price List  Follow-Up Plan: Patient will follow-up at [facility] 2 weeks post operatively for diet advancement per MD.

## 2015-07-24 ENCOUNTER — Ambulatory Visit: Payer: Self-pay | Admitting: General Surgery

## 2015-07-24 NOTE — Patient Instructions (Addendum)
YOUR PROCEDURE IS SCHEDULED ON :  07/30/15  REPORT TO [facility] MAIN ENTRANCE FOLLOW SIGNS TO EAST ELEVATOR - GO TO 3rd FLOOR CHECK IN AT 3 EAST NURSES STATION (SHORT STAY) AT:  12:45 PM  CALL THIS NUMBER IF YOU HAVE PROBLEMS THE MORNING OF SURGERY [phone]  REMEMBER:ONLY 1 PER PERSON MAY GO TO SHORT STAY WITH YOU TO GET READY THE MORNING OF YOUR SURGERY  DO NOT EAT FOOD  AFTER MIDNIGHT  MAY HAVE CLEAR LIQUIDS UNTIL 8:45 AM  TAKE THESE MEDICINES THE MORNING OF SURGERY:  ALLOPURINOL / CARVEDILOL / DILTIAZEM  CLEAR LIQUID DIET  Foods Allowed                                                                     Foods Excluded  Coffee and tea, regular and decaf                             liquids that you cannot  Plain Jell-O in any flavor                                             see through such as: Fruit ices (not with fruit pulp)                                     milk, soups, orange juice  Iced Popsicles                                                        All solid food Carbonated beverages, regular and diet                                    Cranberry, grape and apple juices Sports drinks like Gatorade Lightly seasoned clear broth or consume(fat free) Sugar, honey syrup   _____________________________________________________________________  BRING C-PAP TUBING AND MASK TO HOSPITAL   YOU MAY NOT HAVE ANY METAL ON YOUR BODY INCLUDING HAIR PINS AND PIERCING'S. DO NOT WEAR JEWELRY, MAKEUP, LOTIONS, POWDERS OR PERFUMES. DO NOT WEAR NAIL POLISH. DO NOT SHAVE 48 HRS PRIOR TO SURGERY. MEN MAY SHAVE FACE AND NECK.  DO NOT BRING VALUABLES TO HOSPITAL. [facility] IS NOT RESPONSIBLE FOR VALUABLES.  CONTACTS, DENTURES OR PARTIALS MAY NOT BE WORN TO SURGERY. LEAVE SUITCASE IN CAR. CAN BE BROUGHT TO ROOM AFTER SURGERY.  PATIENTS DISCHARGED THE DAY OF SURGERY WILL NOT BE ALLOWED TO DRIVE HOME.  PLEASE READ OVER THE FOLLOWING INSTRUCTION  SHEETS _________________________________________________________________________________                                          [facility] -  PREPARING FOR SURGERY  Before surgery, you can play an important role.  Because skin is not sterile, your skin needs to be as free of germs as possible.  You can reduce the number of germs on your skin by washing with CHG (chlorahexidine gluconate) soap before surgery.  CHG is an antiseptic cleaner which kills germs and bonds with the skin to continue killing germs even after washing. Please DO NOT use if you have an allergy to CHG or antibacterial soaps.  If your skin becomes reddened/irritated stop using the CHG and inform your nurse when you arrive at Short Stay. Do not shave (including legs and underarms) for at least 48 hours prior to the first CHG shower.  You may shave your face. Please follow these instructions carefully:   1.  Shower with CHG Soap the night before surgery and the  morning of Surgery.   2.  If you choose to wash your hair, wash your hair first as usual with your  normal  Shampoo.   3.  After you shampoo, rinse your hair and body thoroughly to remove the  shampoo.                                         4.  Use CHG as you would any other liquid soap.  You can apply chg directly  to the skin and wash . Gently wash with scrungie or clean wascloth    5.  Apply the CHG Soap to your body ONLY FROM THE NECK DOWN.   Do not use on open                           Wound or open sores. Avoid contact with eyes, ears mouth and genitals (private parts).                        Genitals (private parts) with your normal soap.              6.  Wash thoroughly, paying special attention to the area where your surgery  will be performed.   7.  Thoroughly rinse your body with warm water from the neck down.   8.  DO NOT shower/wash with your normal soap after using and rinsing off  the CHG Soap .                9.  Pat yourself dry with a clean  towel.             10.  Wear clean night clothes to bed after shower             11.  Place clean sheets on your bed the night of your first shower and do not  sleep with pets.  Day of Surgery : Do not apply any lotions/deodorants the morning of surgery.  Please wear clean clothes to the hospital/surgery center.  FAILURE TO FOLLOW THESE INSTRUCTIONS MAY RESULT IN THE CANCELLATION OF YOUR SURGERY    PATIENT SIGNATURE_________________________________  ______________________________________________________________________

## 2015-07-26 ENCOUNTER — Encounter (HOSPITAL_COMMUNITY)
Admission: RE | Admit: 2015-07-26 | Discharge: 2015-07-26 | Disposition: A | Discharge status: Home / Self Care | Payer: BLUE CROSS/BLUE SHIELD | Source: Ambulatory Visit | Attending: General Surgery | Admitting: General Surgery

## 2015-07-26 ENCOUNTER — Encounter (HOSPITAL_COMMUNITY): Payer: Self-pay

## 2015-07-26 ENCOUNTER — Encounter (INDEPENDENT_AMBULATORY_CARE_PROVIDER_SITE_OTHER): Payer: Self-pay

## 2015-07-26 DIAGNOSIS — Z01818 Encounter for other preprocedural examination: Secondary | ICD-10-CM | POA: Insufficient documentation

## 2015-07-26 HISTORY — DX: Personal history of other diseases of the musculoskeletal system and connective tissue: Z87.39

## 2015-07-26 HISTORY — DX: Cardiac arrhythmia, unspecified: I49.9

## 2015-07-26 LAB — APTT: APTT: 30 s (ref 24–37)

## 2015-07-26 LAB — CBC WITH DIFFERENTIAL/PLATELET
BASOS ABS: 0 10*3/uL (ref 0.0–0.1)
BASOS PCT: 0 %
EOS PCT: 2 %
Eosinophils Absolute: 0.2 10*3/uL (ref 0.0–0.7)
HCT: 51.4 % (ref 39.0–52.0)
Hemoglobin: 17.1 g/dL — ABNORMAL HIGH (ref 13.0–17.0)
Lymphocytes Relative: 28 %
Lymphs Abs: 1.7 10*3/uL (ref 0.7–4.0)
MCH: 33.9 pg (ref 26.0–34.0)
MCHC: 33.3 g/dL (ref 30.0–36.0)
MCV: 101.8 fL — ABNORMAL HIGH (ref 78.0–100.0)
Monocytes Absolute: 0.5 10*3/uL (ref 0.1–1.0)
Monocytes Relative: 7 %
NEUTROS ABS: 3.9 10*3/uL (ref 1.7–7.7)
Neutrophils Relative %: 63 %
Platelets: 222 10*3/uL (ref 150–400)
RBC: 5.05 MIL/uL (ref 4.22–5.81)
RDW: 13.4 % (ref 11.5–15.5)
WBC: 6.3 10*3/uL (ref 4.0–10.5)

## 2015-07-26 LAB — COMPREHENSIVE METABOLIC PANEL
ALBUMIN: 4.2 g/dL (ref 3.5–5.0)
ALT: 40 U/L (ref 17–63)
AST: 38 U/L (ref 15–41)
Alkaline Phosphatase: 48 U/L (ref 38–126)
Anion gap: 13 (ref 5–15)
BUN: 19 mg/dL (ref 6–20)
CHLORIDE: 99 mmol/L — AB (ref 101–111)
CO2: 29 mmol/L (ref 22–32)
Calcium: 9.8 mg/dL (ref 8.9–10.3)
Creatinine, Ser: 0.98 mg/dL (ref 0.61–1.24)
GFR calc Af Amer: >60 mL/min (ref >60)
GFR calc non Af Amer: >60 mL/min (ref >60)
GLUCOSE: 91 mg/dL (ref 65–99)
POTASSIUM: 4.3 mmol/L (ref 3.5–5.1)
Sodium: 141 mmol/L (ref 135–145)
Total Bilirubin: 0.7 mg/dL (ref 0.3–1.2)
Total Protein: 7.8 g/dL (ref 6.5–8.1)

## 2015-07-26 LAB — PROTIME-INR
INR: 1.19 (ref 0.00–1.49)
Prothrombin Time: 15.2 seconds (ref 11.6–15.2)

## 2015-07-30 ENCOUNTER — Inpatient Hospital Stay (HOSPITAL_COMMUNITY)
Admission: RE | Admit: 2015-07-30 | Discharge: 2015-08-01 | DRG: 620 | Disposition: A | Discharge status: Home / Self Care | Payer: BLUE CROSS/BLUE SHIELD | Source: Ambulatory Visit | Attending: General Surgery | Admitting: General Surgery

## 2015-07-30 ENCOUNTER — Inpatient Hospital Stay (HOSPITAL_COMMUNITY): Payer: BLUE CROSS/BLUE SHIELD | Admitting: Registered Nurse

## 2015-07-30 ENCOUNTER — Encounter (HOSPITAL_COMMUNITY): Payer: Self-pay | Admitting: *Deleted

## 2015-07-30 ENCOUNTER — Encounter (HOSPITAL_COMMUNITY)
Admission: RE | Disposition: A | Discharge status: Home / Self Care | Payer: Self-pay | Source: Ambulatory Visit | Attending: General Surgery

## 2015-07-30 DIAGNOSIS — G4733 Obstructive sleep apnea (adult) (pediatric): Secondary | ICD-10-CM | POA: Diagnosis present

## 2015-07-30 DIAGNOSIS — K219 Gastro-esophageal reflux disease without esophagitis: Secondary | ICD-10-CM | POA: Diagnosis present

## 2015-07-30 DIAGNOSIS — Z79899 Other long term (current) drug therapy: Secondary | ICD-10-CM

## 2015-07-30 DIAGNOSIS — Z6841 Body Mass Index (BMI) 40.0 and over, adult: Secondary | ICD-10-CM

## 2015-07-30 DIAGNOSIS — M1990 Unspecified osteoarthritis, unspecified site: Secondary | ICD-10-CM | POA: Diagnosis present

## 2015-07-30 DIAGNOSIS — Z7901 Long term (current) use of anticoagulants: Secondary | ICD-10-CM

## 2015-07-30 DIAGNOSIS — I10 Essential (primary) hypertension: Secondary | ICD-10-CM | POA: Diagnosis present

## 2015-07-30 DIAGNOSIS — I481 Persistent atrial fibrillation: Secondary | ICD-10-CM | POA: Diagnosis present

## 2015-07-30 DIAGNOSIS — Z01812 Encounter for preprocedural laboratory examination: Secondary | ICD-10-CM

## 2015-07-30 DIAGNOSIS — Z7982 Long term (current) use of aspirin: Secondary | ICD-10-CM | POA: Diagnosis not present

## 2015-07-30 HISTORY — PX: LAPAROSCOPIC GASTRIC SLEEVE RESECTION: SHX5895

## 2015-07-30 LAB — CBC
HEMATOCRIT: 50.6 % (ref 39.0–52.0)
HEMOGLOBIN: 16.8 g/dL (ref 13.0–17.0)
MCH: 33.8 pg (ref 26.0–34.0)
MCHC: 33.2 g/dL (ref 30.0–36.0)
MCV: 101.8 fL — ABNORMAL HIGH (ref 78.0–100.0)
Platelets: 186 10*3/uL (ref 150–400)
RBC: 4.97 MIL/uL (ref 4.22–5.81)
RDW: 13.1 % (ref 11.5–15.5)
WBC: 10.1 10*3/uL (ref 4.0–10.5)

## 2015-07-30 LAB — CREATININE, SERUM
Creatinine, Ser: 0.88 mg/dL (ref 0.61–1.24)
GFR calc Af Amer: >60 mL/min (ref >60)

## 2015-07-30 SURGERY — GASTRECTOMY, SLEEVE, LAPAROSCOPIC
Anesthesia: General

## 2015-07-30 MED ORDER — PROMETHAZINE HCL 25 MG/ML IJ SOLN: 6.2500 mg | INTRAMUSCULAR | Status: DC | PRN

## 2015-07-30 MED ORDER — 0.9 % SODIUM CHLORIDE (POUR BTL) OPTIME
TOPICAL | Status: DC | PRN
  Administered 2015-07-30: 1000 mL

## 2015-07-30 MED ORDER — PREMIER PROTEIN SHAKE
2.0000 [oz_av] | Freq: Four times a day (QID) | ORAL | Status: DC
  Administered 2015-08-01 (×2): 2 [oz_av] via ORAL
  Filled 2015-07-30: qty 325.31

## 2015-07-30 MED ORDER — ONDANSETRON HCL 4 MG/2ML IJ SOLN
INTRAMUSCULAR | Status: AC
  Filled 2015-07-30: qty 2

## 2015-07-30 MED ORDER — CEFOXITIN SODIUM 2 G IV SOLR
INTRAVENOUS | Status: AC
  Filled 2015-07-30: qty 2

## 2015-07-30 MED ORDER — OXYCODONE HCL 5 MG/5ML PO SOLN
5.0000 mg | ORAL | Status: DC | PRN
  Administered 2015-07-31 – 2015-08-01 (×5): 10 mg via ORAL
  Filled 2015-07-30 (×2): qty 10
  Filled 2015-07-30: qty 25
  Filled 2015-07-30: qty 50
  Filled 2015-07-30: qty 10

## 2015-07-30 MED ORDER — LIDOCAINE HCL (CARDIAC) 20 MG/ML IV SOLN
INTRAVENOUS | Status: AC
  Filled 2015-07-30: qty 5

## 2015-07-30 MED ORDER — MIDAZOLAM HCL 2 MG/2ML IJ SOLN
INTRAMUSCULAR | Status: AC
  Filled 2015-07-30: qty 2

## 2015-07-30 MED ORDER — CEFOXITIN SODIUM 2 G IV SOLR
2.0000 g | INTRAVENOUS | Status: AC
  Administered 2015-07-30: 2 g via INTRAVENOUS

## 2015-07-30 MED ORDER — ACETAMINOPHEN 160 MG/5ML PO SOLN
650.0000 mg | ORAL | Status: DC | PRN
  Filled 2015-07-30 (×2): qty 20.3

## 2015-07-30 MED ORDER — MEPERIDINE HCL 50 MG/ML IJ SOLN: 6.2500 mg | INTRAMUSCULAR | Status: DC | PRN

## 2015-07-30 MED ORDER — ROCURONIUM BROMIDE 100 MG/10ML IV SOLN
INTRAVENOUS | Status: AC
  Filled 2015-07-30: qty 1

## 2015-07-30 MED ORDER — PROPOFOL 10 MG/ML IV BOLUS
INTRAVENOUS | Status: AC
  Filled 2015-07-30: qty 40

## 2015-07-30 MED ORDER — ROCURONIUM BROMIDE 100 MG/10ML IV SOLN
INTRAVENOUS | Status: DC | PRN
Start: 2015-07-30 — End: 2015-07-30
  Administered 2015-07-30: 50 mg via INTRAVENOUS
  Administered 2015-07-30: 20 mg via INTRAVENOUS
  Administered 2015-07-30 (×3): 10 mg via INTRAVENOUS

## 2015-07-30 MED ORDER — BUPIVACAINE-EPINEPHRINE (PF) 0.25% -1:200000 IJ SOLN
INTRAMUSCULAR | Status: AC
  Filled 2015-07-30: qty 30

## 2015-07-30 MED ORDER — SODIUM CHLORIDE 0.9 % IJ SOLN
INTRAMUSCULAR | Status: DC | PRN
  Administered 2015-07-30: 40 mL

## 2015-07-30 MED ORDER — LIDOCAINE HCL (CARDIAC) 20 MG/ML IV SOLN
INTRAVENOUS | Status: DC | PRN
  Administered 2015-07-30: 100 mg via INTRAVENOUS

## 2015-07-30 MED ORDER — PANTOPRAZOLE SODIUM 40 MG IV SOLR
40.0000 mg | Freq: Every day | INTRAVENOUS | Status: DC
  Administered 2015-07-30 – 2015-07-31 (×2): 40 mg via INTRAVENOUS
  Filled 2015-07-30 (×2): qty 40

## 2015-07-30 MED ORDER — LACTATED RINGERS IV SOLN: INTRAVENOUS | Status: DC

## 2015-07-30 MED ORDER — SODIUM CHLORIDE 0.9 % IJ SOLN
INTRAMUSCULAR | Status: AC
  Filled 2015-07-30: qty 50

## 2015-07-30 MED ORDER — DEXAMETHASONE SODIUM PHOSPHATE 10 MG/ML IJ SOLN
INTRAMUSCULAR | Status: DC | PRN
  Administered 2015-07-30: 10 mg via INTRAVENOUS

## 2015-07-30 MED ORDER — FENTANYL CITRATE (PF) 100 MCG/2ML IJ SOLN
INTRAMUSCULAR | Status: AC
Start: 2015-07-30 — End: 2015-07-30
  Filled 2015-07-30: qty 2

## 2015-07-30 MED ORDER — ENOXAPARIN SODIUM 30 MG/0.3ML ~~LOC~~ SOLN
30.0000 mg | Freq: Two times a day (BID) | SUBCUTANEOUS | Status: DC
  Administered 2015-07-31 – 2015-08-01 (×3): 30 mg via SUBCUTANEOUS
  Filled 2015-07-30 (×3): qty 0.3

## 2015-07-30 MED ORDER — LACTATED RINGERS IV SOLN
INTRAVENOUS | Status: DC | PRN
  Administered 2015-07-30 (×2): via INTRAVENOUS

## 2015-07-30 MED ORDER — ONDANSETRON HCL 4 MG/2ML IJ SOLN: 4.0000 mg | INTRAMUSCULAR | Status: DC | PRN

## 2015-07-30 MED ORDER — FENTANYL CITRATE (PF) 100 MCG/2ML IJ SOLN
INTRAMUSCULAR | Status: AC
  Filled 2015-07-30: qty 2

## 2015-07-30 MED ORDER — LACTATED RINGERS IR SOLN
Status: DC | PRN
  Administered 2015-07-30: 1

## 2015-07-30 MED ORDER — MIDAZOLAM HCL 5 MG/5ML IJ SOLN
INTRAMUSCULAR | Status: DC | PRN
  Administered 2015-07-30: 2 mg via INTRAVENOUS

## 2015-07-30 MED ORDER — ONDANSETRON HCL 4 MG/2ML IJ SOLN
INTRAMUSCULAR | Status: DC | PRN
  Administered 2015-07-30: 4 mg via INTRAVENOUS

## 2015-07-30 MED ORDER — FENTANYL CITRATE (PF) 100 MCG/2ML IJ SOLN
25.0000 ug | INTRAMUSCULAR | Status: DC | PRN
  Administered 2015-07-30: 50 ug via INTRAVENOUS

## 2015-07-30 MED ORDER — FENTANYL CITRATE (PF) 250 MCG/5ML IJ SOLN
INTRAMUSCULAR | Status: AC
  Filled 2015-07-30: qty 5

## 2015-07-30 MED ORDER — SUGAMMADEX SODIUM 500 MG/5ML IV SOLN
INTRAVENOUS | Status: AC
  Filled 2015-07-30: qty 5

## 2015-07-30 MED ORDER — DEXAMETHASONE SODIUM PHOSPHATE 10 MG/ML IJ SOLN
INTRAMUSCULAR | Status: AC
  Filled 2015-07-30: qty 1

## 2015-07-30 MED ORDER — BUPIVACAINE LIPOSOME 1.3 % IJ SUSP
20.0000 mL | Freq: Once | INTRAMUSCULAR | Status: AC
  Administered 2015-07-30: 20 mL
  Filled 2015-07-30: qty 20

## 2015-07-30 MED ORDER — DEXTROSE-NACL 5-0.45 % IV SOLN
INTRAVENOUS | Status: DC
  Administered 2015-07-30 – 2015-08-01 (×7): via INTRAVENOUS

## 2015-07-30 MED ORDER — MORPHINE SULFATE (PF) 2 MG/ML IV SOLN
2.0000 mg | INTRAVENOUS | Status: DC | PRN
  Administered 2015-07-30 (×4): 6 mg via INTRAVENOUS
  Administered 2015-07-31: 4 mg via INTRAVENOUS
  Administered 2015-07-31 (×2): 6 mg via INTRAVENOUS
  Administered 2015-07-31: 2 mg via INTRAVENOUS
  Administered 2015-07-31 (×2): 6 mg via INTRAVENOUS
  Filled 2015-07-30 (×3): qty 3
  Filled 2015-07-30: qty 1
  Filled 2015-07-30 (×3): qty 3
  Filled 2015-07-30: qty 2
  Filled 2015-07-30 (×2): qty 3

## 2015-07-30 MED ORDER — ACETAMINOPHEN 160 MG/5ML PO SOLN
325.0000 mg | ORAL | Status: DC | PRN
  Administered 2015-07-31 – 2015-08-01 (×3): 650 mg via ORAL
  Filled 2015-07-30: qty 20.3

## 2015-07-30 MED ORDER — ACETAMINOPHEN 10 MG/ML IV SOLN
INTRAVENOUS | Status: AC
  Filled 2015-07-30: qty 100

## 2015-07-30 MED ORDER — ENOXAPARIN SODIUM 40 MG/0.4ML ~~LOC~~ SOLN
40.0000 mg | SUBCUTANEOUS | Status: AC
  Administered 2015-07-30: 40 mg via SUBCUTANEOUS
  Filled 2015-07-30: qty 0.4

## 2015-07-30 MED ORDER — ACETAMINOPHEN 10 MG/ML IV SOLN
INTRAVENOUS | Status: DC | PRN
  Administered 2015-07-30: 1000 mg via INTRAVENOUS

## 2015-07-30 MED ORDER — FENTANYL CITRATE (PF) 100 MCG/2ML IJ SOLN
INTRAMUSCULAR | Status: DC | PRN
  Administered 2015-07-30 (×3): 50 ug via INTRAVENOUS
  Administered 2015-07-30: 100 ug via INTRAVENOUS
  Administered 2015-07-30 (×2): 50 ug via INTRAVENOUS

## 2015-07-30 MED ORDER — PROPOFOL 10 MG/ML IV BOLUS
INTRAVENOUS | Status: DC | PRN
  Administered 2015-07-30: 300 mg via INTRAVENOUS

## 2015-07-30 MED ORDER — SUCCINYLCHOLINE CHLORIDE 20 MG/ML IJ SOLN
INTRAMUSCULAR | Status: DC | PRN
  Administered 2015-07-30: 140 mg via INTRAVENOUS

## 2015-07-30 MED ORDER — SUGAMMADEX SODIUM 200 MG/2ML IV SOLN
INTRAVENOUS | Status: DC | PRN
  Administered 2015-07-30: 500 mg via INTRAVENOUS

## 2015-07-30 SURGICAL SUPPLY — 65 items
APPLIER CLIP 5 13 M/L LIGAMAX5 (MISCELLANEOUS)
APPLIER CLIP ROT 10 11.4 M/L (STAPLE)
APPLIER CLIP ROT 13.4 12 LRG (CLIP)
APR CLP LRG 13.4X12 ROT 20 MLT (CLIP)
APR CLP MED LRG 11.4X10 (STAPLE)
APR CLP MED LRG 5 ANG JAW (MISCELLANEOUS)
BLADE SURG 15 STRL LF DISP TIS (BLADE) ×1 IMPLANT
BLADE SURG 15 STRL SS (BLADE) ×2
CABLE HIGH FREQUENCY MONO STRZ (ELECTRODE) IMPLANT
CATH ROBINSON RED A/P 12FR (CATHETERS) IMPLANT
CHLORAPREP W/TINT 26ML (MISCELLANEOUS) ×2 IMPLANT
CLIP APPLIE 5 13 M/L LIGAMAX5 (MISCELLANEOUS) IMPLANT
CLIP APPLIE ROT 10 11.4 M/L (STAPLE) IMPLANT
CLIP APPLIE ROT 13.4 12 LRG (CLIP) IMPLANT
COVER SURGICAL LIGHT HANDLE (MISCELLANEOUS) IMPLANT
DRAIN CHANNEL 19F RND (DRAIN) IMPLANT
DRAPE CAMERA CLOSED 9X96 (DRAPES) ×2 IMPLANT
ELECT L-HOOK LAP 45CM DISP (ELECTROSURGICAL)
ELECT PENCIL ROCKER SW 15FT (MISCELLANEOUS) IMPLANT
ELECT REM PT RETURN 9FT ADLT (ELECTROSURGICAL) ×2
ELECTRODE L-HOOK LAP 45CM DISP (ELECTROSURGICAL) IMPLANT
ELECTRODE REM PT RTRN 9FT ADLT (ELECTROSURGICAL) ×1 IMPLANT
ENDO GIA UNIVERSAL XLG (ENDOMECHANICALS) ×2 IMPLANT
EVACUATOR SILICONE 100CC (DRAIN) IMPLANT
GAUZE SPONGE 4X4 12PLY STRL (GAUZE/BANDAGES/DRESSINGS) IMPLANT
GLOVE BIOGEL PI IND STRL 7.0 (GLOVE) ×1 IMPLANT
GLOVE BIOGEL PI INDICATOR 7.0 (GLOVE) ×1
GOWN STRL REUS W/TWL XL LVL3 (GOWN DISPOSABLE) ×6 IMPLANT
HANDLE STAPLE EGIA 4 XL (STAPLE) ×2 IMPLANT
HOVERMATT SINGLE USE (MISCELLANEOUS) ×2 IMPLANT
KIT BASIN OR (CUSTOM PROCEDURE TRAY) ×2 IMPLANT
LIQUID BAND (GAUZE/BANDAGES/DRESSINGS) ×1 IMPLANT
NDL SPNL 22GX3.5 QUINCKE BK (NEEDLE) ×1 IMPLANT
NEEDLE HYPO 22GX1.5 SAFETY (NEEDLE) ×1 IMPLANT
NEEDLE SPNL 22GX3.5 QUINCKE BK (NEEDLE) ×2 IMPLANT
PACK UNIVERSAL I (CUSTOM PROCEDURE TRAY) ×2 IMPLANT
PEN SKIN MARKING BROAD (MISCELLANEOUS) ×2 IMPLANT
POUCH ENDO CATCH II 15MM (MISCELLANEOUS) ×2 IMPLANT
RELOAD EGIA 45 MED/THCK PURPLE (STAPLE) IMPLANT
RELOAD STAPLE 60 BLK XTHK ART (STAPLE) IMPLANT
RELOAD TRI 2.0 60 XTHK VAS SUL (STAPLE) IMPLANT
RELOAD TRI 60 ART MED THCK BLK (STAPLE) ×3 IMPLANT
RELOAD TRI 60 ART MED THCK PUR (STAPLE) ×2 IMPLANT
SCISSORS LAP 5X45 EPIX DISP (ENDOMECHANICALS) ×2 IMPLANT
SET IRRIG TUBING LAPAROSCOPIC (IRRIGATION / IRRIGATOR) ×2 IMPLANT
SHEARS HARMONIC ACE PLUS 45CM (MISCELLANEOUS) ×2 IMPLANT
SLEEVE GASTRECTOMY 40FR VISIGI (MISCELLANEOUS) ×2 IMPLANT
SLEEVE XCEL OPT CAN 5 100 (ENDOMECHANICALS) ×6 IMPLANT
SOLUTION ANTI FOG 6CC (MISCELLANEOUS) ×2 IMPLANT
SPONGE LAP 18X18 X RAY DECT (DISPOSABLE) ×2 IMPLANT
SUT ETHILON 2 0 PS N (SUTURE) IMPLANT
SUT MNCRL AB 4-0 PS2 18 (SUTURE) ×2 IMPLANT
SUT SILK 0 SH 30 (SUTURE) IMPLANT
SUT VICRYL 0 UR6 27IN ABS (SUTURE) ×2 IMPLANT
SYR 20CC LL (SYRINGE) ×2 IMPLANT
SYR 30ML LL (SYRINGE) ×1 IMPLANT
SYR 50ML LL SCALE MARK (SYRINGE) ×2 IMPLANT
SYSTEM CARTER THOMASON II (TROCAR) ×2 IMPLANT
TOWEL OR 17X26 10 PK STRL BLUE (TOWEL DISPOSABLE) ×2 IMPLANT
TOWEL OR NON WOVEN STRL DISP B (DISPOSABLE) ×2 IMPLANT
TROCAR BLADELESS 15MM (ENDOMECHANICALS) ×2 IMPLANT
TROCAR BLADELESS OPT 5 100 (ENDOMECHANICALS) ×2 IMPLANT
TUBING CONNECTING 10 (TUBING) ×2 IMPLANT
TUBING ENDO SMARTCAP (MISCELLANEOUS) ×2 IMPLANT
TUBING FILTER THERMOFLATOR (ELECTROSURGICAL) ×2 IMPLANT

## 2015-07-30 NOTE — Anesthesia Procedure Notes (Signed)
Procedure Name: Intubation Date/Time: 07/30/2015 5:33 PM Performed by: Orest Dikes Pre-anesthesia Checklist: Patient identified, Emergency Drugs available, Suction available and Patient being monitored Patient Re-evaluated:Patient Re-evaluated prior to inductionOxygen Delivery Method: Circle System Utilized Preoxygenation: Pre-oxygenation with 100% oxygen Intubation Type: IV induction Ventilation: Mask ventilation without difficulty and Oral airway inserted - appropriate to patient size Laryngoscope Size: Glidescope and 4 Grade View: Grade I Tube type: Oral Tube size: 7.5 mm Number of attempts: 1 Airway Equipment and Method: Stylet and Oral airway Placement Confirmation: ETT inserted through vocal cords under direct vision,  positive ETCO2 and breath sounds checked- equal and bilateral Secured at: 22 cm Tube secured with: Tape Dental Injury: Teeth and Oropharynx as per pre-operative assessment  Difficulty Due To: Difficulty was anticipated and Difficult Airway- due to large tongue Future Recommendations: Recommend- induction with short-acting agent, and alternative techniques readily available Comments: Due to BMI, decision to use Glidescope 4 for intubation. Easy mask ventilation with oral airway in place. DL X 1 with Glidescope 4 and ETT gently passed with ETCO2+, BBS=.

## 2015-07-30 NOTE — Anesthesia Preprocedure Evaluation (Addendum)
Anesthesia Evaluation  Patient identified by MRN, date of birth, ID band Patient awake    Reviewed: Allergy & Precautions, NPO status , Patient's Chart, lab work & pertinent test results  Airway Mallampati: II  TM Distance: >3 FB Neck ROM: Full    Dental  (+) Teeth Intact   Pulmonary sleep apnea ,    breath sounds clear to auscultation       Cardiovascular hypertension, Pt. on medications + dysrhythmias Atrial Fibrillation  Rhythm:Regular Rate:Normal     Neuro/Psych negative neurological ROS  negative psych ROS   GI/Hepatic negative GI ROS, Neg liver ROS, GERD  Medicated,  Endo/Other    Renal/GU ARFRenal disease  negative genitourinary   Musculoskeletal negative musculoskeletal ROS (+)   Abdominal   Peds negative pediatric ROS (+)  Hematology negative hematology ROS (+)   Anesthesia Other Findings   Reproductive/Obstetrics negative OB ROS                            Lab Results  Component Value Date   WBC 6.3 07/26/2015   HGB 17.1* 07/26/2015   HCT 51.4 07/26/2015   MCV 101.8* 07/26/2015   PLT 222 07/26/2015   Lab Results  Component Value Date   CREATININE 0.98 07/26/2015   BUN 19 07/26/2015   NA 141 07/26/2015   K 4.3 07/26/2015   CL 99* 07/26/2015   CO2 29 07/26/2015   Lab Results  Component Value Date   INR 1.19 07/26/2015   INR 1.9 06/07/2015   INR 2.1 02/09/2015   EKG: atrial fibrillation.  Echo (2015) - Left ventricle: Poor endocardial definition consider contrast or MRI to further evaluate RWMA&'s The cavity size was normal. Wall thickness was increased in a pattern of mild LVH. Systolic function was normal. Wall motion was normal; there were no regional wall motion abnormalities. - Left atrium: The atrium was mildly dilated. - Right ventricle: The cavity size was mildly dilated. Wall thickness was normal.  Anesthesia Physical Anesthesia  Plan  ASA: IV  Anesthesia Plan: General   Post-op Pain Management:    Induction: Intravenous  Airway Management Planned: Oral ETT and Video Laryngoscope Planned  Additional Equipment:   Intra-op Plan:   Post-operative Plan: Extubation in OR  Informed Consent: I have reviewed the patients History and Physical, chart, labs and discussed the procedure including the risks, benefits and alternatives for the proposed anesthesia with the patient or authorized representative who has indicated his/her understanding and acceptance.   Dental advisory given  Plan Discussed with: CRNA  Anesthesia Plan Comments:         Anesthesia Quick Evaluation

## 2015-07-30 NOTE — H&P (Signed)
Patient words: Travis Bautista.  The patient is a 54 year old male who presents for a bariatric surgery evaluation. Mister Travis Bautista has completed his preoperative evaluation and is following the diet appropriately. He has no changes in medications. He was surprised by his weight gain but he has large fluctuations due to edema at times. He is ambulating well. All questions were answered at this time.    Allergies  No Known Drug Allergies09/23/2016  Medication History  Allopurinol (300MG  Tablet, Oral) Active. Carvedilol (12.5MG  Tablet, Oral) Active. Colchicine (0.6MG  Tablet, Oral) Active. Furosemide (40MG  Tablet, Oral) Active. Ramipril (5MG  Capsule, Oral) Active. Spironolactone (25MG  Tablet, Oral) Active. Warfarin Sodium (5MG  Tablet, Oral) Active. Aspirin (81MG  Tablet, Oral) Active. Diltiazem CD (120MG  Capsule ER 24HR, Oral) Active. Medications Reconciled    Review of Systems  General Present- Weight Gain. Not Present- Appetite Loss, Chills, Fatigue, Fever, Night Sweats and Weight Loss. Skin Present- New Lesions. Not Present- Change in Wart/Mole, Dryness, Hives, Jaundice, Non-Healing Wounds, Rash and Ulcer. HEENT Present- Sinus Pain. Not Present- Earache, Hearing Loss, Hoarseness, Nose Bleed, Oral Ulcers, Ringing in the Ears, Seasonal Allergies, Sore Throat, Visual Disturbances, Wears glasses/contact lenses and Yellow Eyes. Respiratory Not Present- Bloody sputum, Chronic Cough, Difficulty Breathing, Snoring and Wheezing. Breast Not Present- Breast Mass, Breast Pain, Nipple Discharge and Skin Changes. Cardiovascular Not Present- Chest Pain, Difficulty Breathing Lying Down, Leg Cramps, Palpitations, Rapid Heart Rate, Shortness of Breath and Swelling of Extremities. Gastrointestinal Not Present- Abdominal Pain, Bloating, Bloody Stool, Change in Bowel Habits, Chronic diarrhea, Constipation, Difficulty Swallowing, Excessive gas, Gets full quickly at meals, Hemorrhoids, Indigestion,  Nausea, Rectal Pain and Vomiting. Male Genitourinary Not Present- Blood in Urine, Change in Urinary Stream, Frequency, Impotence, Nocturia, Painful Urination, Urgency and Urine Leakage. Musculoskeletal Present- Back Pain. Not Present- Joint Pain, Joint Stiffness, Muscle Pain, Muscle Weakness and Swelling of Extremities. Neurological Not Present- Decreased Memory, Fainting, Headaches, Numbness, Seizures, Tingling, Tremor, Trouble walking and Weakness. Psychiatric Not Present- Anxiety, Bipolar, Change in Sleep Pattern, Depression, Fearful and Frequent crying. Hematology Not Present- Easy Bruising, Excessive bleeding, Gland problems, HIV and Persistent Infections.  BP 143/82 mmHg  Pulse 83  Temp(Src) 98.2 F (36.8 C) (Oral)  Resp 18  Ht 5\' 11"  (1.803 m)  Wt 216.989 kg (478 lb 6 oz)  BMI 66.75 kg/m2  SpO2 94%  Physical Exam  General Mental Status-Alert. General Appearance-Cooperative. Orientation-Oriented X4. Build & Nutrition-Obese. Posture-Normal posture.  Integumentary Global Assessment Upon inspection and palpation of skin surfaces of the - Head/Face: no rashes, ulcers, lesions or evidence of photo damage. No palpable nodules or masses and Neck: no visible lesions or palpable masses.  Head and Neck Head-normocephalic, atraumatic with no lesions or palpable masses. Face Global Assessment - atraumatic. Thyroid Gland Characteristics - normal size and consistency.  Eye Eyeball - Bilateral-Extraocular movements intact. Sclera/Conjunctiva - Bilateral-No scleral icterus, No Discharge.  ENMT Nose and Sinuses External Inspection of the Nose - no deformities observed, no swelling present.  Chest and Lung Exam Palpation Palpation of the chest reveals - Non-tender. Auscultation Breath sounds - Normal.  Cardiovascular Auscultation Rhythm - Regular. Heart Sounds - S1 WNL and S2 WNL. Carotid arteries - No Carotid bruit.  Abdomen Inspection Inspection of the  abdomen reveals - No Visible peristalsis, No Abnormal pulsations and No Paradoxical movements. Palpation/Percussion Palpation and Percussion of the abdomen reveal - Soft, Non Tender, No Rebound tenderness, No Rigidity (guarding), No hepatosplenomegaly and No Palpable abdominal masses.  Peripheral Vascular Upper Extremity Palpation - Pulses bilaterally normal. Lower Extremity  Palpation - Edema - Bilateral - No edema.  Neurologic Neurologic evaluation reveals -normal sensation and normal coordination.  Neuropsychiatric Mental status exam performed with findings of-able to articulate well with normal speech/language, rate, volume and coherence and thought content normal with ability to perform basic computations and apply abstract reasoning.  Musculoskeletal Normal Exam - Bilateral-Upper Extremity Strength Normal and Lower Extremity Strength Normal.    Assessment & Plan  MORBID OBESITY, UNSPECIFIED OBESITY TYPE (E66.01) Story: procedure: sleeve gastrectomy date: initial BMI: 68.6 preop BMI: Impression: [name] has had a long history with struggling with his weight. He associates his weight with leading to things like gout as well as his back issues and notes that last time he was 80 pounds lighter he was much more mobile and had much less back issues. He has completed preoperative evaluation and is ready for surgery. We, again, discussed the risks of the procedure in terms of hernia risk, conduit stenosis, risk of DVT or PE, risk of leak, cardiac or respiratory issues. We discussed the overall benefits in terms of improvement and hypertension back pain weight loss of 150 pounds or more. We also discussed that this is a tool and must be accompanied with appropriate diet and exercise.  We outlined that for his procedure I would like him off Coumadin 5 days before, then he would undergo Lovenox in the hospital preoperatively and postoperatively and then continue Lovenox for 10 days  afterwards. He would restart Coumadin 7 days after surgery so that there would be some breakage of the Lovenox. Given his chads score I think that this is the best option for his treatment.  He is screening positive for sleep apnea, he was given a mask but does not have a machine yet. I reached up to the pulmonary specialist for advice in this matter and also asked Proctor to contact his primary care physician about obtaining a machine for home. Due to this finding and his size we will admit him to at least intermediate care facility can undergo CPAP in the postoperative period. Current Plans Started OxyCODONE HCl /5ML, 5-10 Milliliter every four hours, as needed, 200 Milliliter, 07/20/2015, No Refill. Started Zofran ODT , 1 (one) Tablet Disperse every four hours, as needed, #20, 07/20/2015, No Refill. Started Lovenox /0.6ML, 1 (one) Syringe daily, 10 Syringe, 10 days starting 08/02/2015, No Refill. Local Order: begin after surgery Started Protonix , 1 (one) Tablet daily, #90, 07/20/2015, No Refill. OBSTRUCTIVE SLEEP APNEA, ADULT (G47.33) Impression: plan for CPAP NAFLD (NONALCOHOLIC FATTY LIVER DISEASE) (K76.

## 2015-07-30 NOTE — Op Note (Signed)
**Note Travis-Identified via Obfuscation** Preop Diagnosis: Obesity Class III  Postop Diagnosis: same  Procedure performed: laparoscopic sleeve gastrectomy  Assitant: Jaclynn Guarneri  Indications:  The patient is a 54 y.o. year-old morbidly obese male who has been followed in the Bariatric Clinic as an outpatient. This patient was diagnosed with morbid obesity with a BMI of Body mass index is 66.75 kg/(m^2). and significant co-morbidities including hypertension, chronic back problems, coronary heart disease and osteoarthritis.  The patient was counseled extensively in the Bariatric Outpatient Clinic and after a thorough explanation of the risks and benefits of surgery (including death from complications, bowel leak, infection such as peritonitis and/or sepsis, internal hernia, bleeding, need for blood transfusion, bowel obstruction, organ failure, pulmonary embolus, deep venous thrombosis, wound infection, incisional hernia, skin breakdown, and others entailed on the consent form) and after a compliant diet and exercise program, the patient was scheduled for an elective laparoscopic sleeve gastrectomy.  Description of Operation:  Following informed consent, the patient was taken to the operating room and placed on the operating table in the supine position.  He had previously received prophylactic antibiotics and subcutaneous heparin for DVT prophylaxis in the pre-op holding area.  After induction of general endotracheal anesthesia by the anesthesiologist, the patient underwent placement of sequential compression devices, Foley catheter and an oro-gastric tube.  A timeout was confirmed by the surgery and anesthesia teams.  The patient was adequately padded at all pressure points and placed on a footboard to prevent slippage from the OR table during extremes of position during surgery.  He underwent a routine sterile prep and drape of her entire abdomen.    Next, A transverse incision was made under the left subcostal area and a 5mm optical viewing  trocar was introduced into the peritoneal cavity. Pneumoperitoneum was applied with a high flow and low pressure. A laparoscope was inserted to confirm placement. A extraperitoneal block was then placed at the lateral abdominal wall using exparel . 4 additional trocars were placed: 1 5mm trocar to the left of the midline. 1 additional 5mm trocar in the left lateral area, 1 15mm trocar in the right mid abdomen, and 1 5mm trocar in the right subcostal area. Finally a Nathanson retractor was placed in the subcostal space and used to retract the liver.  After taking down the adhesions using the Harmonic scalpel, the stomach was identified and decompressed by the oro-gastric tube.  The patient was then placed into steep reverse Trendelenburg position and the anesthesiologist was asked to empty the stomach and then remove ALL tubes (including the oro-gastric tube and the esophageal temperature probe) from the patient's GI tract.  After identification of the greater curvature and the pylorus, the lesser sac was entered along the greater curvature of the stomach.  Using the Harmonic scalpel, the gastroepiploic vessels and the short gastric vessels were cauterized close to the stomach wall and the process was continued proximally along the greater curvature up to the angle of His. The left crus was identified and all posterior adhesions to the fundus were severed. The greater curvature anterior wall and posterior wall was fully separated from the short gastric vessels.  At this point, a 45 [place] ViSiGi  was passed orally by the anesthesiologist and advanced down to the stomach.  The dilator was pushed against the lesser curvature and put on suction. Then using a laparoscopic Endo GIA 60mm extra-thick reinforced stapler was used to start the tubularization of the stomach. Following that, several 60mm thick reinforced loads of the stapler were  used to create a thin sleeve gastrectomy, carefully sizing the pouch to a total  approximate volume of 100 cc, and removing nearly 80% of the stomach along the greater curvature.   At the end of the creation of sleeve gastrectomy, the staple line was closely examined and minor bleeding from the staple line was controlled. The ViSiGi was used for leak test, there was no evidence of any bubbles denoting absence of a leak. Next the assistant performed an upper endoscopy noting no abnormalities. After adequate hemostasis, the resected stomach was placed into specimen bags and later removed through the 15 mm right upper quadrant incision after minimal dilatation.  This trocar site was then closed using a suture passer and a 0 Vicryl suture.  All trocars were removed under direct visualization after achieving hemostasis.  The skin was approximated using 4-0 Monocryl subcuticular sutures followed by application of Dermabond. Drapes were drawn and the patient was then transferred to the recovery room in an intubated but stable condition.  He was later extubated without difficulty.  Specimens:  Gastric sleeve resection.  Post-Op Plan:       Pain Management: PO, prn      Antibiotics: Prophylactic      Anticoagulation: Prophylactic, Starting now      Post Op Studies/Consults: Not applicable      Intended Discharge: within 48h      Intended Outpatient Follow-Up: Two Week      Intended Outpatient Studies: Not Applicable      Other: Not Applicable   Travis Bautista

## 2015-07-30 NOTE — Op Note (Signed)
Procedure: Upper GI endoscopy  Description of procedure: Upper GI endoscopy is performed at the completion of laparoscopic sleeve gastrectomy by Dr.  Sheliah Hatch.  The video endoscope was introduced into the upper esophagus and then passed to the EG junction at about 40 cm. The esophagus appeared normal. The gastric sleeve was entered. The sleeve was tensely distended with air while the outlet was obstructed under saline irrigation by the operating surgeon. There was no evidence of leak. The staple line was intact and without bleeding. The scope was advanced to the antrum and pylorus visualized. There was no stricture or twisting or mucosal abnormality, and particularly no narrowing noted at the incisura.  The pouch was then desufflated and the scope withdrawn.  Travis Saa MD, FACS  07/30/2015, 5:15 PM

## 2015-07-30 NOTE — Progress Notes (Signed)
Fentanyl given 50 mcg for a total of 100 mcg in pacu

## 2015-07-30 NOTE — Transfer of Care (Signed)
Immediate Anesthesia Transfer of Care Note  Patient: Travis Bautista  Procedure(s) Performed: Procedure(s): LAPAROSCOPIC GASTRIC SLEEVE RESECTION WITH UPPER ENDOSCOPY (N/A)  Patient Location: PACU  Anesthesia Type:General  Level of Consciousness:  sedated, patient cooperative and responds to stimulation  Airway & Oxygen Therapy:Patient Spontanous Breathing and Patient connected to face mask oxgen  Post-op Assessment:  Report given to PACU RN and Post -op Vital signs reviewed and stable  Post vital signs:  Reviewed and stable  Last Vitals:  Filed Vitals:   07/30/15 1311  BP: 143/82  Pulse: 83  Temp: 36.8 C  Resp: 18    Complications: No apparent anesthesia complications

## 2015-07-31 ENCOUNTER — Encounter (HOSPITAL_COMMUNITY): Payer: Self-pay | Admitting: General Surgery

## 2015-07-31 LAB — CBC WITH DIFFERENTIAL/PLATELET
BASOS ABS: 0 10*3/uL (ref 0.0–0.1)
BASOS PCT: 0 %
EOS ABS: 0 10*3/uL (ref 0.0–0.7)
EOS PCT: 0 %
HCT: 48.7 % (ref 39.0–52.0)
Hemoglobin: 16.7 g/dL (ref 13.0–17.0)
Lymphocytes Relative: 9 %
Lymphs Abs: 0.7 10*3/uL (ref 0.7–4.0)
MCH: 34.5 pg — ABNORMAL HIGH (ref 26.0–34.0)
MCHC: 34.3 g/dL (ref 30.0–36.0)
MCV: 100.6 fL — ABNORMAL HIGH (ref 78.0–100.0)
Monocytes Absolute: 0.1 10*3/uL (ref 0.1–1.0)
Monocytes Relative: 2 %
Neutro Abs: 7.2 10*3/uL (ref 1.7–7.7)
Neutrophils Relative %: 89 %
PLATELETS: 214 10*3/uL (ref 150–400)
RBC: 4.84 MIL/uL (ref 4.22–5.81)
RDW: 13 % (ref 11.5–15.5)
WBC: 8 10*3/uL (ref 4.0–10.5)

## 2015-07-31 LAB — HEMOGLOBIN AND HEMATOCRIT, BLOOD
HCT: 50.2 % (ref 39.0–52.0)
HEMOGLOBIN: 16.9 g/dL (ref 13.0–17.0)

## 2015-07-31 MED ORDER — DILTIAZEM HCL ER COATED BEADS 240 MG PO CP24
240.0000 mg | ORAL_CAPSULE | Freq: Every day | ORAL | Status: DC
  Administered 2015-07-31 – 2015-08-01 (×2): 240 mg via ORAL
  Filled 2015-07-31 (×2): qty 1

## 2015-07-31 MED ORDER — CARVEDILOL 12.5 MG PO TABS
12.5000 mg | ORAL_TABLET | Freq: Two times a day (BID) | ORAL | Status: DC
  Administered 2015-07-31 – 2015-08-01 (×2): 12.5 mg via ORAL
  Filled 2015-07-31 (×2): qty 1

## 2015-07-31 NOTE — Anesthesia Postprocedure Evaluation (Signed)
Anesthesia Post Note  Patient: Travis Bautista  Procedure(s) Performed: Procedure(s) (LRB): LAPAROSCOPIC GASTRIC SLEEVE RESECTION WITH UPPER ENDOSCOPY (N/A)  Patient location during evaluation: PACU Anesthesia Type: General Level of consciousness: awake and alert Pain management: pain level controlled Vital Signs Assessment: post-procedure vital signs reviewed and stable Respiratory status: spontaneous breathing, nonlabored ventilation, respiratory function stable and patient connected to nasal cannula oxygen Cardiovascular status: blood pressure returned to baseline and stable Postop Assessment: no signs of nausea or vomiting Anesthetic complications: no    Last Vitals:  Filed Vitals:   07/30/15 2001 07/31/15 0004  BP:    Pulse:  111  Temp: 36.7 C   Resp:  17    Last Pain:  Filed Vitals:   07/31/15 0259  PainSc: Asleep                 Shelton Silvas

## 2015-07-31 NOTE — Progress Notes (Signed)
  Progress Note: General Surgery Service   Subjective: Used CPAP briefly yesterday but was ambulating multiple times so did not continue use  Objective: Vital signs in last 24 hours: Temp:  [97.6 F (36.4 C)-98.2 F (36.8 C)] 97.7 F (36.5 C) (12/13 0400) Pulse Rate:  [74-124] 115 (12/13 0800) Resp:  [10-28] 15 (12/13 0800) BP: (134-179)/(79-122) 176/103 mmHg (12/13 0800) SpO2:  [86 %-100 %] 95 % (12/13 0800) Weight:  [216.989 kg (478 lb 6 oz)] 216.989 kg (478 lb 6 oz) (12/12 1311)    Intake/Output from previous day: 12/12 0701 - 12/13 0700 In: 2150 [I.V.:2100; IV Piggyback:50] Out: 440 [Urine:440] Intake/Output this shift: Total I/O In: 1713.3 [I.V.:1693.3; Other:20] Out: -   Lungs: CTAB  Cardiovascular: tachycardic  Abd: soft, NT, ND, wounds c/d/i  Extremities: no edema  Neuro: AOx4  Lab Results: CBC   Recent Labs  07/30/15 2010 07/31/15 0404  WBC 10.1 8.0  HGB 16.8 16.7  HCT 50.6 48.7  PLT 186 214   BMET  Recent Labs  07/30/15 2010  CREATININE 0.88   PT/INR No results for input(s): LABPROT, INR in the last 72 hours. ABG No results for input(s): PHART, HCO3 in the last 72 hours.  Invalid input(s): PCO2, PO2  Studies/Results:  Anti-infectives: Anti-infectives    Start     Dose/Rate Route Frequency Ordered Stop   07/31/15 0600  cefOXitin (MEFOXIN) 2 g in dextrose 5 % 50 mL IVPB     2 g 100 mL/hr over 30 Minutes Intravenous On call to O.R. 07/30/15 1330 07/30/15 1546      Medications: Scheduled Meds: . enoxaparin (LOVENOX) injection  30 mg Subcutaneous Q12H  . pantoprazole (PROTONIX) IV  40 mg Intravenous QHS  . [START ON 08/01/2015] protein supplement shake  2 oz Oral QID   Continuous Infusions: . dextrose 5 % and 0.45% NaCl 200 mL/hr at 07/31/15 0732   PRN Meds:.oxyCODONE **AND** acetaminophen, acetaminophen (TYLENOL) oral liquid 160 mg/5 mL, morphine injection, ondansetron (ZOFRAN) IV  Assessment/Plan: Patient Active Problem List    Diagnosis Date Noted  . Severe obesity (HCC) 07/30/2015  . Acute kidney injury (HCC) 10/24/2014  . Bilateral ankle pain 10/23/2014  . Persistent atrial fibrillation (HCC) 09/20/2013  . NICM (nonischemic cardiomyopathy) (HCC) 09/20/2013  . Leg edema 09/20/2013  . Morbid obesity (HCC) 09/20/2013  . OSA (obstructive sleep apnea) 09/20/2013  . HTN (hypertension) 09/20/2013  . Chronic anticoagulation 09/20/2013   s/p Procedure(s): LAPAROSCOPIC GASTRIC SLEEVE RESECTION WITH UPPER ENDOSCOPY 07/30/2015 -water per protocol -transfer to floor -continue pulse ox monitoring   LOS: 1 day   Rodman Pickle, MD Pg# [phone] [facility]

## 2015-07-31 NOTE — Plan of Care (Signed)
Problem: Food- and Nutrition-Related Knowledge Deficit (NB-1.1) Goal: Nutrition education Formal process to instruct or train a patient/client in a skill or to impart knowledge to help patients/clients voluntarily manage or modify food choices and eating behavior to maintain or improve health. Outcome: Completed/Met Date Met:  07/31/15 Nutrition Education Note  Received consult for diet education per DROP protocol.   Discussed 2 week post op diet with pt. Emphasized that liquids must be non carbonated, non caffeinated, and sugar free. Fluid goals discussed. Pt to follow up with outpatient bariatric RD for further diet progression after 2 weeks. Multivitamins and minerals also reviewed. Teach back method used, pt expressed understanding, expect good compliance.   Diet: First 2 Weeks  You will see the nutritionist about two (2) weeks after your surgery. The nutritionist will increase the types of foods you can eat if you are handling liquids well:  If you have severe vomiting or nausea and cannot handle clear liquids lasting longer than 1 day, call your surgeon  Protein Shake  Drink at least 2 ounces of shake 5-6 times per day  Each serving of protein shakes (usually 8 - 12 ounces) should have a minimum of:  15 grams of protein  And no more than 5 grams of carbohydrate  Goal for protein each day:  Men = 80 grams per day  Women = 60 grams per day  Protein powder may be added to fluids such as non-fat milk or Lactaid milk or Soy milk (limit to 35 grams added protein powder per serving)   Hydration  Slowly increase the amount of water and other clear liquids as tolerated (See Acceptable Fluids)  Slowly increase the amount of protein shake as tolerated  Sip fluids slowly and throughout the day  May use sugar substitutes in small amounts (no more than 6 - 8 packets per day; i.e. Splenda)   Fluid Goal  The first goal is to drink at least 8 ounces of protein shake/drink per day (or as directed  by the nutritionist); some examples of protein shakes are Syntrax Nectar, Adkins Advantage, EAS Edge HP, and Unjury. See handout from pre-op Bariatric Education Class:  Slowly increase the amount of protein shake you drink as tolerated  You may find it easier to slowly sip shakes throughout the day  It is important to get your proteins in first  Your fluid goal is to drink 64 - 100 ounces of fluid daily  It may take a few weeks to build up to this  32 oz (or more) should be clear liquids  And  32 oz (or more) should be full liquids (see below for examples)  Liquids should not contain sugar, caffeine, or carbonation   Clear Liquids:  Water or Sugar-free flavored water (i.e. Fruit H2O, Propel)  Decaffeinated coffee or tea (sugar-free)  Crystal Lite, Wyler's Lite, Minute Maid Lite  Sugar-free Jell-O  Bouillon or broth  Sugar-free Popsicle: *Less than 20 calories each; Limit 1 per day   Full Liquids:  Protein Shakes/Drinks + 2 choices per day of other full liquids  Full liquids must be:  No More Than 12 grams of Carbs per serving  No More Than 3 grams of Fat per serving  Strained low-fat cream soup  Non-Fat milk  Fat-free Lactaid Milk  Sugar-free yogurt (Dannon Lite & Fit, Greek yogurt)     Lubna Stegeman, MS, RD, LDN Pager: [phone] After Hours Pager: [phone]        

## 2015-08-01 LAB — CBC WITH DIFFERENTIAL/PLATELET
BASOS PCT: 0 %
Basophils Absolute: 0 10*3/uL (ref 0.0–0.1)
EOS ABS: 0 10*3/uL (ref 0.0–0.7)
EOS PCT: 0 %
HCT: 46.3 % (ref 39.0–52.0)
Hemoglobin: 15.4 g/dL (ref 13.0–17.0)
LYMPHS ABS: 0.8 10*3/uL (ref 0.7–4.0)
Lymphocytes Relative: 7 %
MCH: 34.1 pg — AB (ref 26.0–34.0)
MCHC: 33.3 g/dL (ref 30.0–36.0)
MCV: 102.7 fL — AB (ref 78.0–100.0)
MONO ABS: 1.1 10*3/uL — AB (ref 0.1–1.0)
Monocytes Relative: 9 %
NEUTROS ABS: 9.9 10*3/uL — AB (ref 1.7–7.7)
NEUTROS PCT: 84 %
PLATELETS: 236 10*3/uL (ref 150–400)
RBC: 4.51 MIL/uL (ref 4.22–5.81)
RDW: 13.2 % (ref 11.5–15.5)
WBC: 11.8 10*3/uL — ABNORMAL HIGH (ref 4.0–10.5)

## 2015-08-01 NOTE — Progress Notes (Signed)
Patient alert and oriented, Post op day 2.  Provided support and encouragement.  Encouraged pulmonary toilet, ambulation and small sips of liquids, advanced to protein shake.  Will return to bedside for discharge teaching when wife arrives.  All questions answered.  Will continue to monitor.

## 2015-08-01 NOTE — Progress Notes (Signed)
Patient is ready for discharge with no further questions. Discharge instructions reviewed.   and patient knows to [facility] for CPAP.

## 2015-08-01 NOTE — Discharge Instructions (Signed)

## 2015-08-01 NOTE — Progress Notes (Signed)
Patient alert and oriented, pain is controlled. Patient is tolerating fluids,  advanced to protein shake today, patient tolerated well.  Reviewed Gastric sleeve discharge instructions with patient and patient is able to articulate understanding.  Provided information on BELT program, Support Group and [facility] outpatient pharmacy. All questions answered, will continue to monitor.  

## 2015-08-01 NOTE — Progress Notes (Signed)
Received pt from [place], RN agree with prior assessment

## 2015-08-03 ENCOUNTER — Telehealth: Payer: Self-pay | Admitting: Internal Medicine

## 2015-08-03 NOTE — Telephone Encounter (Signed)
Pt called in stating that he just had bariatric surgery and he was discharged yesterday, he would like to know which meds he should start back taking. Please f/u with him

## 2015-08-03 NOTE — Telephone Encounter (Signed)
Discussed meds in context of recent discharge. Advised to continue on medications as he has been recommended in discharge notes. Advised w/ weight loss will be important to monitor for symptoms of dehydration & to call us if BP running low or if he is dizzy on standing, etc. Also to monitor urine OP w/ diuretics. If noticeable changes to call.  I also offered to schedule pt to see Dr. Rennis Golden. He voiced thanks, states he will call next week to set something up if he feels need to be seen. O/w, he has been instructed to f/u at 1 yr mark (June 2017) if no new problems.

## 2015-08-03 NOTE — Discharge Summary (Signed)
Physician Discharge Summary  Patient ID: Travis Bautista MRN: [id number] DOB/AGE: [birth year] 52 y.o.  Admit date: 07/30/2015 Discharge date: 08/03/2015  Admission Diagnoses:  Discharge Diagnoses:  Active Problems:   Severe obesity (HCC)   Discharged Condition: good  Hospital Course: Pt admitted after lap sleeve, stayed night in stepdown with CPAP in place. POD 1 did well was advanced to water and POD 2 tolerated shake and discharge home. He ambulated well and pain was controlled throughout his stay  Consults: respiratory therapy  Significant Diagnostic Studies: labs: WBC 11.8, HGB 15.4  Treatments: laparoscopic sleeve gastrectomy, CPAP  Discharge Exam: Blood pressure 146/86, pulse 101, temperature 98.6 F (37 C), temperature source Oral, resp. rate 20, height  (1.803 m), weight 216.989 kg (478 lb 6 oz), SpO2 92 %. General appearance: alert and appears stated age Head: Normocephalic, without obvious abnormality, atraumatic Resp: clear to auscultation bilaterally Cardio: regular rate and rhythm, S1, S2 normal, no murmur, click, rub or gallop GI: soft, non-tender; bowel sounds normal; no masses,  no organomegaly and wounds c/d/i  Disposition: 01-Home or Self Care  Discharge Instructions    Call MD for:  difficulty breathing, headache or visual disturbances    Complete by:  As directed      Call MD for:  persistant nausea and vomiting    Complete by:  As directed      Call MD for:  severe uncontrolled pain    Complete by:  As directed      Call MD for:  temperature >100.4    Complete by:  As directed      Discharge instructions    Complete by:  As directed   Start lovenox shots tomorrow, start coumadin 12/19. If dehydrated skip furosemide.     Increase activity slowly    Complete by:  As directed             Medication List    STOP taking these medications        sulfamethoxazole-trimethoprim 800-160 MG tablet  Commonly known as:  BACTRIM DS,SEPTRA DS       TAKE these medications        allopurinol 300 MG tablet  Commonly known as:  ZYLOPRIM  Take 1 tablet (300 mg total) by mouth daily.     aspirin EC 81 MG tablet  Take 81 mg by mouth daily.  Notes to Patient:  Avoid NSAIDs for 6-8 weeks after surgery      carvedilol 12.5 MG tablet  Commonly known as:  COREG  TAKE ONE TABLET BY MOUTH TWICE DAILY WITH MEALS  Notes to Patient:  Monitor Blood Pressure Daily and keep a log for primary care physician.  You may need to make changes to your medications with rapid weight loss.        colchicine 0.6 MG tablet  Take 1 tablet (0.6 mg total) by mouth daily.     diltiazem 240 MG 24 hr capsule  Commonly known as:  CARDIZEM CD  TAKE ONE CAPSULE BY MOUTH ONE TIME DAILY  Notes to Patient:  Monitor Blood Pressure Daily and keep a log for primary care physician.  You may need to make changes to your medications with rapid weight loss.        furosemide 40 MG tablet  Commonly known as:  LASIX  TAKE ONE TABLET BY MOUTH TWICE DAILY  Notes to Patient:  Monitor Blood Pressure Daily and keep a log for primary care physician.  Monitor for symptoms of dehydration.  You may need to make changes to your medications with rapid weight loss.        multivitamin tablet  Take 1 tablet by mouth daily.     ondansetron 4 MG disintegrating tablet  Commonly known as:  ZOFRAN-ODT  Take 4 mg by mouth every 4 (four) hours as needed. Nausea and vomiting     oxyCODONE 5 MG immediate release tablet  Commonly known as:  Oxy IR/ROXICODONE  Take 1-2 tablets (5-10 mg total) by mouth every 6 (six) hours as needed for severe pain.     pantoprazole 40 MG tablet  Commonly known as:  PROTONIX  Take 40 mg by mouth daily.     ramipril 5 MG capsule  Commonly known as:  ALTACE  TAKE ONE CAPSULE BY MOUTH ONE TIME DAILY  Notes to Patient:  Monitor Blood Pressure Daily and keep a log for primary care physician.  You may need to make changes to your medications with rapid weight  loss.       spironolactone 25 MG tablet  Commonly known as:  ALDACTONE  TAKE ONE-HALF TABLET BY MOUTH DAILY  Notes to Patient:  Monitor Blood Pressure Daily and keep a log for primary care physician.  Monitor for symptoms of dehydration.  You may need to make changes to your medications with rapid weight loss.        warfarin 5 MG tablet  Commonly known as:  COUMADIN  Take 2.5-5 mg by mouth daily. Takes 5mg  daily except 2.5mg  on Tues and Thurs.           Follow-up Information    Follow up with Rodman Pickle, MD. Go on 08/22/2015.   Specialty:  General Surgery   Why:  For Post-Op Check at 4:00    Contact information:   [address] [place] [address] [place] [address] [phone]       Follow up with Rodman Pickle, MD. Go on 09/07/2015.   Specialty:  General Surgery   Why:  For Post-Op Check at 10:00   Contact information:   [address] [place] [address] [place] [address] [phone]       Follow up with [facility] On 08/02/2015.   Why:  Please call for CPAP, if you have not received a call from [facility].    Contact information:   [address] [place] [address] [phone]       [name] 08/03/2015, 11:10 AM

## 2015-08-06 ENCOUNTER — Telehealth (HOSPITAL_COMMUNITY): Payer: Self-pay

## 2015-08-06 NOTE — Telephone Encounter (Signed)
Made discharge phone call to patient per DROP protocol. Asking the following questions.    1. Do you have someone to care for you now that you are home?  yes 2. Are you having pain now that is not relieved by your pain medication?  yes 3. Are you able to drink the recommended daily amount of fluids (48 ounces minimum/day) and protein (60-80 grams/day) as prescribed by the dietitian or nutritional counselor?  yes 4. Are you taking the vitamins and minerals as prescribed?  yes 5. Do you have the "on call" number to contact your surgeon if you have a problem or question?  yes 6. Are your incisions free of redness, swelling or drainage? (If steri strips, address that these can fall off, shower as tolerated) yes 7. Have your bowels moved since your surgery?  If not, are you passing gas?  yes 8. Are you up and walking 3-4 times per day?  yes       

## 2015-08-14 ENCOUNTER — Encounter: Payer: BLUE CROSS/BLUE SHIELD | Attending: General Surgery

## 2015-08-14 DIAGNOSIS — Z713 Dietary counseling and surveillance: Secondary | ICD-10-CM | POA: Diagnosis not present

## 2015-08-14 DIAGNOSIS — Z6841 Body Mass Index (BMI) 40.0 and over, adult: Secondary | ICD-10-CM | POA: Diagnosis not present

## 2015-08-15 NOTE — Progress Notes (Signed)
Bariatric Class:  Appt start time: 1530 end time:  1630.  2 Week Post-Operative Nutrition Class  Patient was seen on 08/14/2015 for Post-Operative Nutrition education at [facility].   Surgery date: 07/30/15 Surgery type: Sleeve gastrectomy Start weight at [facility]: 490 lbs on 06/21/15 Weight today: 445.3 lbs Weight change: 46.4 lbs  TANITA  BODY COMP RESULTS  07/16/15 08/14/15   BMI (kg/m^2) N/A N/A   Fat Mass (lbs)     Fat Free Mass (lbs)     Total Body Water (lbs)      The following the learning objectives were met by the patient during this course:  Identifies Phase 3A (Soft, High Proteins) Dietary Goals and will begin from 2 weeks post-operatively to 2 months post-operatively  Identifies appropriate sources of fluids and proteins   States protein recommendations and appropriate sources post-operatively  Identifies the need for appropriate texture modifications, mastication, and bite sizes when consuming solids  Identifies appropriate multivitamin and calcium sources post-operatively  Describes the need for physical activity post-operatively and will follow MD recommendations  States when to call healthcare provider regarding medication questions or post-operative complications  Handouts given during class include:  Phase 3A: Soft, High Protein Diet Handout  Band Fill Guidelines Handout  Follow-Up Plan: Patient will follow-up at [facility] in 4 weeks for 6 week post-op nutrition visit for diet advancement per MD.

## 2015-08-16 ENCOUNTER — Other Ambulatory Visit: Payer: Self-pay | Admitting: Internal Medicine

## 2015-08-21 ENCOUNTER — Encounter: Payer: BLUE CROSS/BLUE SHIELD | Admitting: Pharmacist Clinician (PhC)/ Clinical Pharmacy Specialist

## 2015-08-21 ENCOUNTER — Ambulatory Visit (INDEPENDENT_AMBULATORY_CARE_PROVIDER_SITE_OTHER): Payer: BLUE CROSS/BLUE SHIELD | Admitting: Pharmacist Clinician (PhC)/ Clinical Pharmacy Specialist

## 2015-08-21 DIAGNOSIS — I481 Persistent atrial fibrillation: Secondary | ICD-10-CM | POA: Diagnosis not present

## 2015-08-21 DIAGNOSIS — I4819 Other persistent atrial fibrillation: Secondary | ICD-10-CM

## 2015-08-21 DIAGNOSIS — I4891 Unspecified atrial fibrillation: Secondary | ICD-10-CM | POA: Diagnosis not present

## 2015-08-21 LAB — POCT INR: INR: 1.3

## 2015-09-03 ENCOUNTER — Encounter: Payer: BLUE CROSS/BLUE SHIELD | Admitting: Pharmacist Clinician (PhC)/ Clinical Pharmacy Specialist

## 2015-09-04 ENCOUNTER — Encounter: Payer: BLUE CROSS/BLUE SHIELD | Admitting: Pharmacist Clinician (PhC)/ Clinical Pharmacy Specialist

## 2015-09-07 ENCOUNTER — Encounter: Payer: BLUE CROSS/BLUE SHIELD | Admitting: Pharmacist Clinician (PhC)/ Clinical Pharmacy Specialist

## 2015-09-10 ENCOUNTER — Encounter: Payer: BLUE CROSS/BLUE SHIELD | Admitting: Pharmacist Clinician (PhC)/ Clinical Pharmacy Specialist

## 2015-09-10 ENCOUNTER — Ambulatory Visit (INDEPENDENT_AMBULATORY_CARE_PROVIDER_SITE_OTHER): Payer: BLUE CROSS/BLUE SHIELD | Admitting: Pharmacist Clinician (PhC)/ Clinical Pharmacy Specialist

## 2015-09-10 ENCOUNTER — Other Ambulatory Visit: Payer: Self-pay | Admitting: Internal Medicine

## 2015-09-10 DIAGNOSIS — I4819 Other persistent atrial fibrillation: Secondary | ICD-10-CM

## 2015-09-10 DIAGNOSIS — I481 Persistent atrial fibrillation: Secondary | ICD-10-CM

## 2015-09-10 DIAGNOSIS — I4891 Unspecified atrial fibrillation: Secondary | ICD-10-CM

## 2015-09-10 LAB — POCT INR: INR: 3.1

## 2015-09-11 NOTE — Telephone Encounter (Signed)
Rx request sent to pharmacy.  

## 2015-09-24 ENCOUNTER — Encounter: Payer: BLUE CROSS/BLUE SHIELD | Admitting: Pharmacist Clinician (PhC)/ Clinical Pharmacy Specialist

## 2015-09-25 ENCOUNTER — Ambulatory Visit: Payer: BLUE CROSS/BLUE SHIELD | Admitting: Dietician

## 2015-10-12 ENCOUNTER — Other Ambulatory Visit: Payer: Self-pay | Admitting: Internal Medicine

## 2015-10-12 MED ORDER — WARFARIN SODIUM 5 MG PO TABS: ORAL_TABLET | ORAL | Status: DC

## 2015-10-12 NOTE — Telephone Encounter (Signed)
Rx(s) sent to pharmacy electronically.  

## 2015-10-12 NOTE — Telephone Encounter (Signed)
°*  STAT* If patient is at the pharmacy, call can be transferred to refill team.   1. Which medications need to be refilled? (please list name of each medication and dose if known) Warfarin-Pharmacist told him to call  2. Which pharmacy/location (including street and city if local pharmacy) is medication to be sent to?[facility] in [place]-[phone]  3. Do they need a 30 day or 90 day supply? 40 and refills

## 2015-10-16 ENCOUNTER — Other Ambulatory Visit: Payer: Self-pay | Admitting: Pharmacist Clinician (PhC)/ Clinical Pharmacy Specialist

## 2015-10-16 MED ORDER — WARFARIN SODIUM 5 MG PO TABS: ORAL_TABLET | ORAL | Status: DC

## 2015-10-17 ENCOUNTER — Other Ambulatory Visit: Payer: Self-pay | Admitting: Internal Medicine

## 2015-10-17 NOTE — Telephone Encounter (Signed)
REFILL 

## 2015-11-07 ENCOUNTER — Other Ambulatory Visit: Payer: Self-pay | Admitting: Internal Medicine

## 2015-11-12 ENCOUNTER — Encounter: Payer: BLUE CROSS/BLUE SHIELD | Admitting: Pharmacist Clinician (PhC)/ Clinical Pharmacy Specialist

## 2015-11-13 ENCOUNTER — Other Ambulatory Visit: Payer: Self-pay | Admitting: Internal Medicine

## 2015-11-13 NOTE — Telephone Encounter (Signed)
REFILL 

## 2015-11-14 ENCOUNTER — Other Ambulatory Visit: Payer: Self-pay | Admitting: Internal Medicine

## 2015-11-14 NOTE — Telephone Encounter (Signed)
Rx filled yesterday

## 2015-11-19 ENCOUNTER — Encounter: Payer: BLUE CROSS/BLUE SHIELD | Admitting: Pharmacist Clinician (PhC)/ Clinical Pharmacy Specialist

## 2015-12-10 ENCOUNTER — Other Ambulatory Visit: Payer: Self-pay | Admitting: Internal Medicine

## 2015-12-10 NOTE — Telephone Encounter (Signed)
Rx(s) sent to pharmacy electronically.  

## 2015-12-19 ENCOUNTER — Other Ambulatory Visit: Payer: Self-pay | Admitting: Internal Medicine

## 2015-12-21 DIAGNOSIS — Z7901 Long term (current) use of anticoagulants: Secondary | ICD-10-CM | POA: Diagnosis not present

## 2015-12-21 DIAGNOSIS — Z9884 Bariatric surgery status: Secondary | ICD-10-CM | POA: Diagnosis not present

## 2015-12-21 DIAGNOSIS — I480 Paroxysmal atrial fibrillation: Secondary | ICD-10-CM | POA: Diagnosis not present

## 2015-12-21 DIAGNOSIS — R7301 Impaired fasting glucose: Secondary | ICD-10-CM | POA: Diagnosis not present

## 2015-12-21 LAB — PROTIME-INR: INR: 2.1 — AB (ref 0.9–1.1)

## 2015-12-24 ENCOUNTER — Telehealth: Payer: Self-pay | Admitting: Internal Medicine

## 2015-12-24 ENCOUNTER — Ambulatory Visit (INDEPENDENT_AMBULATORY_CARE_PROVIDER_SITE_OTHER): Payer: BLUE CROSS/BLUE SHIELD | Admitting: Pharmacist Clinician (PhC)/ Clinical Pharmacy Specialist

## 2015-12-24 DIAGNOSIS — I481 Persistent atrial fibrillation: Secondary | ICD-10-CM

## 2015-12-24 DIAGNOSIS — I4819 Other persistent atrial fibrillation: Secondary | ICD-10-CM

## 2015-12-24 NOTE — Telephone Encounter (Signed)
Received records from [facility] for appointment on 01/04/16 with Dr Rennis Golden.  Records given to [facility] (medical records) for Dr Blanchie Dessert schedule on 01/04/16. lp

## 2016-01-04 ENCOUNTER — Ambulatory Visit (INDEPENDENT_AMBULATORY_CARE_PROVIDER_SITE_OTHER): Payer: BLUE CROSS/BLUE SHIELD | Admitting: Pharmacist

## 2016-01-04 ENCOUNTER — Ambulatory Visit (INDEPENDENT_AMBULATORY_CARE_PROVIDER_SITE_OTHER): Payer: BLUE CROSS/BLUE SHIELD | Admitting: Internal Medicine

## 2016-01-04 ENCOUNTER — Encounter: Payer: Self-pay | Admitting: Internal Medicine

## 2016-01-04 VITALS — BP 120/74 | HR 112 | Ht 71.0 in | Wt >= 6400 oz

## 2016-01-04 DIAGNOSIS — I481 Persistent atrial fibrillation: Secondary | ICD-10-CM

## 2016-01-04 DIAGNOSIS — I4891 Unspecified atrial fibrillation: Secondary | ICD-10-CM | POA: Diagnosis not present

## 2016-01-04 DIAGNOSIS — I4819 Other persistent atrial fibrillation: Secondary | ICD-10-CM

## 2016-01-04 DIAGNOSIS — I429 Cardiomyopathy, unspecified: Secondary | ICD-10-CM

## 2016-01-04 DIAGNOSIS — I428 Other cardiomyopathies: Secondary | ICD-10-CM

## 2016-01-04 DIAGNOSIS — I10 Essential (primary) hypertension: Secondary | ICD-10-CM | POA: Diagnosis not present

## 2016-01-04 LAB — POCT INR: INR: 7

## 2016-01-04 NOTE — Patient Instructions (Signed)
Your physician recommends that you schedule a follow-up appointment in: 3 Months  
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
## 2016-01-07 ENCOUNTER — Ambulatory Visit (INDEPENDENT_AMBULATORY_CARE_PROVIDER_SITE_OTHER): Payer: BLUE CROSS/BLUE SHIELD | Admitting: Pharmacist

## 2016-01-07 ENCOUNTER — Other Ambulatory Visit: Payer: Self-pay | Admitting: Internal Medicine

## 2016-01-07 DIAGNOSIS — I4891 Unspecified atrial fibrillation: Secondary | ICD-10-CM | POA: Diagnosis not present

## 2016-01-07 DIAGNOSIS — I481 Persistent atrial fibrillation: Secondary | ICD-10-CM

## 2016-01-07 DIAGNOSIS — I4819 Other persistent atrial fibrillation: Secondary | ICD-10-CM

## 2016-01-07 LAB — POCT INR: INR: 2.3

## 2016-01-08 NOTE — Telephone Encounter (Signed)
Rx(s) sent to pharmacy electronically.  

## 2016-01-11 ENCOUNTER — Encounter: Payer: BLUE CROSS/BLUE SHIELD | Admitting: Pharmacist Clinician (PhC)/ Clinical Pharmacy Specialist

## 2016-01-14 ENCOUNTER — Other Ambulatory Visit: Payer: Self-pay | Admitting: Internal Medicine

## 2016-01-15 NOTE — Telephone Encounter (Signed)
Rx has been sent to the pharmacy electronically. ° °

## 2016-01-25 ENCOUNTER — Other Ambulatory Visit: Payer: Self-pay | Admitting: Internal Medicine

## 2016-01-25 NOTE — Telephone Encounter (Signed)
Rx request sent to pharmacy.  

## 2016-02-07 ENCOUNTER — Other Ambulatory Visit: Payer: Self-pay | Admitting: Internal Medicine

## 2016-02-20 ENCOUNTER — Ambulatory Visit (INDEPENDENT_AMBULATORY_CARE_PROVIDER_SITE_OTHER): Payer: BLUE CROSS/BLUE SHIELD | Admitting: Pharmacist Clinician (PhC)/ Clinical Pharmacy Specialist

## 2016-02-20 DIAGNOSIS — I481 Persistent atrial fibrillation: Secondary | ICD-10-CM

## 2016-02-20 DIAGNOSIS — I4891 Unspecified atrial fibrillation: Secondary | ICD-10-CM

## 2016-02-20 DIAGNOSIS — I4819 Other persistent atrial fibrillation: Secondary | ICD-10-CM

## 2016-02-20 LAB — POCT INR: INR: 1.3

## 2016-03-10 ENCOUNTER — Other Ambulatory Visit: Payer: Self-pay | Admitting: Internal Medicine

## 2016-03-20 ENCOUNTER — Other Ambulatory Visit: Payer: Self-pay | Admitting: Internal Medicine

## 2016-04-10 ENCOUNTER — Other Ambulatory Visit: Payer: Self-pay | Admitting: Internal Medicine

## 2016-06-16 ENCOUNTER — Other Ambulatory Visit: Payer: Self-pay | Admitting: Pharmacist Clinician (PhC)/ Clinical Pharmacy Specialist

## 2016-06-16 MED ORDER — WARFARIN SODIUM 5 MG PO TABS: ORAL_TABLET | ORAL | 0 refills | Status: DC

## 2016-06-23 ENCOUNTER — Telehealth: Payer: Self-pay | Admitting: Internal Medicine

## 2016-06-23 DIAGNOSIS — R7301 Impaired fasting glucose: Secondary | ICD-10-CM | POA: Diagnosis not present

## 2016-06-23 DIAGNOSIS — Z1389 Encounter for screening for other disorder: Secondary | ICD-10-CM | POA: Diagnosis not present

## 2016-06-23 DIAGNOSIS — I480 Paroxysmal atrial fibrillation: Secondary | ICD-10-CM | POA: Diagnosis not present

## 2016-06-23 DIAGNOSIS — Z23 Encounter for immunization: Secondary | ICD-10-CM | POA: Diagnosis not present

## 2016-06-23 DIAGNOSIS — Z9884 Bariatric surgery status: Secondary | ICD-10-CM | POA: Diagnosis not present

## 2016-06-23 DIAGNOSIS — Z79899 Other long term (current) drug therapy: Secondary | ICD-10-CM | POA: Diagnosis not present

## 2016-06-23 DIAGNOSIS — Z7901 Long term (current) use of anticoagulants: Secondary | ICD-10-CM | POA: Diagnosis not present

## 2016-06-23 DIAGNOSIS — M1A0790 Idiopathic chronic gout, unspecified ankle and foot, without tophus (tophi): Secondary | ICD-10-CM | POA: Diagnosis not present

## 2016-06-23 LAB — POCT INR: INR: 1.6

## 2016-06-23 NOTE — Telephone Encounter (Signed)
Patient has appt with Dr. Felipa Eth today, will have them draw INR and send results to [place].  Will cancel INR appt for here today.

## 2016-06-23 NOTE — Telephone Encounter (Signed)
New Message  Pt voiced needing coumadin nurse to return his call.

## 2016-06-24 ENCOUNTER — Ambulatory Visit (INDEPENDENT_AMBULATORY_CARE_PROVIDER_SITE_OTHER): Payer: BLUE CROSS/BLUE SHIELD | Admitting: Pharmacist

## 2016-06-24 ENCOUNTER — Telehealth: Payer: Self-pay | Admitting: Pharmacist Clinician (PhC)/ Clinical Pharmacy Specialist

## 2016-06-24 DIAGNOSIS — I4819 Other persistent atrial fibrillation: Secondary | ICD-10-CM

## 2016-06-24 DIAGNOSIS — I481 Persistent atrial fibrillation: Secondary | ICD-10-CM

## 2016-06-24 NOTE — Telephone Encounter (Signed)
New message      Pt states that he had his pt/inr checked at his PCP's office yesterday and he want to talk to you about that.  Pt asked for [facility] specifically.

## 2016-07-09 ENCOUNTER — Ambulatory Visit (INDEPENDENT_AMBULATORY_CARE_PROVIDER_SITE_OTHER): Payer: BLUE CROSS/BLUE SHIELD | Admitting: Pharmacist Clinician (PhC)/ Clinical Pharmacy Specialist

## 2016-07-09 DIAGNOSIS — I4819 Other persistent atrial fibrillation: Secondary | ICD-10-CM

## 2016-07-09 DIAGNOSIS — I481 Persistent atrial fibrillation: Secondary | ICD-10-CM | POA: Diagnosis not present

## 2016-07-09 LAB — POCT INR: INR: 2.3

## 2016-07-20 ENCOUNTER — Other Ambulatory Visit: Payer: Self-pay | Admitting: Internal Medicine

## 2016-07-21 NOTE — Telephone Encounter (Signed)
Rx(s) sent to pharmacy electronically.  

## 2016-07-31 ENCOUNTER — Other Ambulatory Visit: Payer: Self-pay | Admitting: Internal Medicine

## 2016-08-25 ENCOUNTER — Other Ambulatory Visit: Payer: Self-pay

## 2016-08-25 MED ORDER — CARVEDILOL 12.5 MG PO TABS: 12.5000 mg | ORAL_TABLET | Freq: Two times a day (BID) | ORAL | 0 refills | Status: DC

## 2016-08-26 ENCOUNTER — Other Ambulatory Visit: Payer: Self-pay | Admitting: Pharmacist Clinician (PhC)/ Clinical Pharmacy Specialist

## 2016-08-26 MED ORDER — WARFARIN SODIUM 5 MG PO TABS: ORAL_TABLET | ORAL | 0 refills | Status: DC

## 2016-08-27 ENCOUNTER — Other Ambulatory Visit: Payer: Self-pay

## 2016-08-27 MED ORDER — DILTIAZEM HCL ER COATED BEADS 240 MG PO CP24: 240.0000 mg | ORAL_CAPSULE | Freq: Every day | ORAL | 0 refills | Status: DC

## 2016-08-27 MED ORDER — RAMIPRIL 5 MG PO CAPS: 5.0000 mg | ORAL_CAPSULE | Freq: Every day | ORAL | 0 refills | Status: DC

## 2016-08-27 MED ORDER — CARVEDILOL 12.5 MG PO TABS
12.5000 mg | ORAL_TABLET | Freq: Two times a day (BID) | ORAL | 0 refills | Status: DC
Start: 2016-08-27 — End: 2016-12-22

## 2016-08-28 ENCOUNTER — Other Ambulatory Visit: Payer: Self-pay | Admitting: Internal Medicine

## 2016-09-02 ENCOUNTER — Telehealth: Payer: Self-pay | Admitting: Internal Medicine

## 2016-09-02 NOTE — Telephone Encounter (Signed)
New Message   Pt stated he had to cancel last few appts, and wanted to make aware that he has the flu.

## 2016-09-02 NOTE — Telephone Encounter (Signed)
This is in reference to his coumadin clinic appts. Routed for review.

## 2016-10-23 ENCOUNTER — Other Ambulatory Visit: Payer: Self-pay | Admitting: Pharmacist Clinician (PhC)/ Clinical Pharmacy Specialist

## 2016-10-23 MED ORDER — WARFARIN SODIUM 5 MG PO TABS: ORAL_TABLET | ORAL | 0 refills | Status: DC

## 2016-10-29 ENCOUNTER — Ambulatory Visit (INDEPENDENT_AMBULATORY_CARE_PROVIDER_SITE_OTHER): Payer: BLUE CROSS/BLUE SHIELD | Admitting: Pharmacist Clinician (PhC)/ Clinical Pharmacy Specialist

## 2016-10-29 DIAGNOSIS — I481 Persistent atrial fibrillation: Secondary | ICD-10-CM

## 2016-10-29 DIAGNOSIS — I4819 Other persistent atrial fibrillation: Secondary | ICD-10-CM

## 2016-10-29 LAB — POCT INR: INR: 5.1

## 2016-12-14 ENCOUNTER — Other Ambulatory Visit: Payer: Self-pay | Admitting: Internal Medicine

## 2016-12-22 ENCOUNTER — Other Ambulatory Visit: Payer: Self-pay | Admitting: *Deleted

## 2016-12-22 MED ORDER — CARVEDILOL 12.5 MG PO TABS: 12.5000 mg | ORAL_TABLET | Freq: Two times a day (BID) | ORAL | 0 refills | Status: DC

## 2017-01-15 ENCOUNTER — Other Ambulatory Visit: Payer: Self-pay | Admitting: Internal Medicine

## 2017-01-16 ENCOUNTER — Telehealth: Payer: Self-pay | Admitting: *Deleted

## 2017-01-16 NOTE — Telephone Encounter (Signed)
Left message for patient to return call. He has requested refill of Carvedilol and needs to make an appointment for follow up with Dr. Rennis Golden.

## 2017-01-21 ENCOUNTER — Other Ambulatory Visit: Payer: Self-pay | Admitting: Internal Medicine

## 2017-01-23 ENCOUNTER — Other Ambulatory Visit: Payer: Self-pay | Admitting: Internal Medicine

## 2017-01-23 NOTE — Telephone Encounter (Signed)
Rx(s) sent to pharmacy electronically.  

## 2017-01-25 ENCOUNTER — Other Ambulatory Visit: Payer: Self-pay | Admitting: Internal Medicine

## 2017-02-03 ENCOUNTER — Ambulatory Visit: Payer: BLUE CROSS/BLUE SHIELD | Admitting: Internal Medicine

## 2017-02-05 ENCOUNTER — Other Ambulatory Visit: Payer: Self-pay | Admitting: Internal Medicine

## 2017-02-06 ENCOUNTER — Other Ambulatory Visit: Payer: Self-pay | Admitting: Internal Medicine

## 2017-02-09 ENCOUNTER — Other Ambulatory Visit: Payer: Self-pay | Admitting: Internal Medicine

## 2017-02-11 ENCOUNTER — Ambulatory Visit (INDEPENDENT_AMBULATORY_CARE_PROVIDER_SITE_OTHER): Payer: BLUE CROSS/BLUE SHIELD | Admitting: Internal Medicine

## 2017-02-11 ENCOUNTER — Encounter: Payer: Self-pay | Admitting: Internal Medicine

## 2017-02-11 ENCOUNTER — Ambulatory Visit (INDEPENDENT_AMBULATORY_CARE_PROVIDER_SITE_OTHER): Payer: BLUE CROSS/BLUE SHIELD | Admitting: Pharmacist

## 2017-02-11 VITALS — BP 110/71 | HR 97 | Ht 71.0 in | Wt >= 6400 oz

## 2017-02-11 DIAGNOSIS — R600 Localized edema: Secondary | ICD-10-CM

## 2017-02-11 DIAGNOSIS — I4819 Other persistent atrial fibrillation: Secondary | ICD-10-CM

## 2017-02-11 DIAGNOSIS — R0602 Shortness of breath: Secondary | ICD-10-CM

## 2017-02-11 DIAGNOSIS — I481 Persistent atrial fibrillation: Secondary | ICD-10-CM | POA: Diagnosis not present

## 2017-02-11 DIAGNOSIS — Z79899 Other long term (current) drug therapy: Secondary | ICD-10-CM

## 2017-02-11 DIAGNOSIS — I428 Other cardiomyopathies: Secondary | ICD-10-CM | POA: Diagnosis not present

## 2017-02-11 LAB — POCT INR: INR: 1.1

## 2017-02-11 MED ORDER — FUROSEMIDE 40 MG PO TABS: ORAL_TABLET | ORAL | 5 refills | Status: DC

## 2017-02-11 MED ORDER — RAMIPRIL 5 MG PO CAPS: 5.0000 mg | ORAL_CAPSULE | Freq: Every day | ORAL | 5 refills | Status: DC

## 2017-02-11 MED ORDER — SPIRONOLACTONE 25 MG PO TABS: 25.0000 mg | ORAL_TABLET | Freq: Every day | ORAL | 5 refills | Status: DC

## 2017-02-11 MED ORDER — CARVEDILOL 12.5 MG PO TABS: 12.5000 mg | ORAL_TABLET | Freq: Two times a day (BID) | ORAL | 5 refills | Status: DC
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
## 2017-02-11 NOTE — Patient Instructions (Signed)
Your physician has recommended you make the following change in your medication:  -- INCREASE lasix 80mg  in the morning 40mg  in the evening  Your physician recommends that you return for lab work in ONE WEEK - [facility], [facility] [address] [employer]  Your physician recommends that you schedule a follow-up appointment in: THREE MONTHS with Dr. Rennis Golden

## 2017-02-19 ENCOUNTER — Other Ambulatory Visit: Payer: Self-pay

## 2017-02-19 MED ORDER — CARVEDILOL 12.5 MG PO TABS
12.5000 mg | ORAL_TABLET | Freq: Two times a day (BID) | ORAL | 3 refills | Status: DC
Start: 2017-02-19 — End: 2018-07-12

## 2017-02-19 NOTE — Telephone Encounter (Signed)
Rx(s) sent to pharmacy electronically.  

## 2017-02-24 ENCOUNTER — Other Ambulatory Visit: Payer: Self-pay | Admitting: Pharmacist

## 2017-02-24 ENCOUNTER — Telehealth: Payer: Self-pay | Admitting: Internal Medicine

## 2017-02-24 MED ORDER — WARFARIN SODIUM 5 MG PO TABS: ORAL_TABLET | ORAL | 0 refills | Status: DC

## 2017-02-24 NOTE — Telephone Encounter (Signed)
Warfarin was refilled earlier today. Has INR check scheduled for tomorrow.

## 2017-02-24 NOTE — Telephone Encounter (Signed)
Patient calling, states that he requested a refill and it has not been approved yet.  Patient also states that he has an appt tomorrow with physician and he will have PT check coumadin, did not want to cancel appt until he spoke with you.

## 2017-02-25 ENCOUNTER — Other Ambulatory Visit: Payer: Self-pay | Admitting: Internal Medicine

## 2017-02-25 DIAGNOSIS — Z9884 Bariatric surgery status: Secondary | ICD-10-CM | POA: Diagnosis not present

## 2017-02-25 DIAGNOSIS — Z7901 Long term (current) use of anticoagulants: Secondary | ICD-10-CM | POA: Diagnosis not present

## 2017-02-25 DIAGNOSIS — I480 Paroxysmal atrial fibrillation: Secondary | ICD-10-CM | POA: Diagnosis not present

## 2017-02-25 DIAGNOSIS — R7301 Impaired fasting glucose: Secondary | ICD-10-CM | POA: Diagnosis not present

## 2017-02-25 DIAGNOSIS — M1A0790 Idiopathic chronic gout, unspecified ankle and foot, without tophus (tophi): Secondary | ICD-10-CM | POA: Diagnosis not present

## 2017-02-25 DIAGNOSIS — I509 Heart failure, unspecified: Secondary | ICD-10-CM | POA: Diagnosis not present

## 2017-02-25 LAB — PROTIME-INR: INR: 1.3 — AB (ref <=1.1)

## 2017-02-25 MED ORDER — WARFARIN SODIUM 5 MG PO TABS: ORAL_TABLET | ORAL | 0 refills | Status: DC

## 2017-02-26 ENCOUNTER — Ambulatory Visit (INDEPENDENT_AMBULATORY_CARE_PROVIDER_SITE_OTHER): Payer: BLUE CROSS/BLUE SHIELD | Admitting: Pharmacist

## 2017-02-26 DIAGNOSIS — I4819 Other persistent atrial fibrillation: Secondary | ICD-10-CM

## 2017-02-26 DIAGNOSIS — I481 Persistent atrial fibrillation: Secondary | ICD-10-CM

## 2017-03-06 ENCOUNTER — Encounter (HOSPITAL_COMMUNITY): Payer: Self-pay

## 2017-03-16 ENCOUNTER — Other Ambulatory Visit: Payer: Self-pay | Admitting: Internal Medicine

## 2017-04-07 ENCOUNTER — Other Ambulatory Visit: Payer: Self-pay | Admitting: Internal Medicine

## 2017-04-09 ENCOUNTER — Other Ambulatory Visit: Payer: Self-pay | Admitting: Internal Medicine

## 2017-04-17 ENCOUNTER — Ambulatory Visit (INDEPENDENT_AMBULATORY_CARE_PROVIDER_SITE_OTHER): Payer: BLUE CROSS/BLUE SHIELD | Admitting: Pharmacist

## 2017-04-17 DIAGNOSIS — I4819 Other persistent atrial fibrillation: Secondary | ICD-10-CM

## 2017-04-17 DIAGNOSIS — I481 Persistent atrial fibrillation: Secondary | ICD-10-CM | POA: Diagnosis not present

## 2017-04-17 LAB — POCT INR: INR: 2.5

## 2017-04-17 MED ORDER — WARFARIN SODIUM 5 MG PO TABS: ORAL_TABLET | ORAL | 0 refills | Status: DC

## 2017-04-27 ENCOUNTER — Other Ambulatory Visit: Payer: Self-pay

## 2017-04-27 MED ORDER — FUROSEMIDE 40 MG PO TABS: 40.0000 mg | ORAL_TABLET | Freq: Two times a day (BID) | ORAL | 6 refills | Status: DC

## 2017-04-28 ENCOUNTER — Telehealth: Payer: Self-pay | Admitting: Internal Medicine

## 2017-04-28 MED ORDER — FUROSEMIDE 40 MG PO TABS: ORAL_TABLET | ORAL | 2 refills | Status: DC

## 2017-04-28 NOTE — Telephone Encounter (Signed)
New Message   pt verbalized that he is returning call for rn  Pt last visit he was informed to take two fluorescamines 2 in the am and 2 in the pm   It is increasing the amount of pills he take daily  Patient wants a new prescription that will increase his dosage

## 2017-04-28 NOTE — Telephone Encounter (Signed)
Returned call to patient who reports Dr. Rennis Golden increased his Lasix to 80mg  in the AM and 40Mg  in the PM but rx was sent in for 40mg  BID therefore he has ran out of medication before he is due for a refill.  Request new rx sent to pharmacy.    Correct rx sent to preferred pharmacy. Patient verbalized understanding.

## 2017-04-30 ENCOUNTER — Other Ambulatory Visit: Payer: Self-pay | Admitting: Internal Medicine

## 2017-04-30 MED ORDER — RAMIPRIL 5 MG PO CAPS: 5.0000 mg | ORAL_CAPSULE | Freq: Every day | ORAL | 1 refills | Status: DC

## 2017-04-30 NOTE — Telephone Encounter (Signed)
Rx(s) sent to pharmacy electronically.  

## 2017-05-12 ENCOUNTER — Telehealth: Payer: Self-pay | Admitting: Internal Medicine

## 2017-05-12 NOTE — Telephone Encounter (Signed)
Called [facility] - patient can get 90 day supply AFTER his appointment with Dr. Rennis Golden on Thursday.

## 2017-05-12 NOTE — Telephone Encounter (Signed)
Returned cal to [place] with [facility] he is requesting 90 day coumadin refill.Advised I will send message to coumadin clinic.

## 2017-05-12 NOTE — Telephone Encounter (Signed)
New Message  Todd from [facility] call requesting to speak with RN about a refill request they received. Please call back to discuss

## 2017-05-12 NOTE — Telephone Encounter (Signed)
90 day supply not appropriate for this patient. Problems with INR checks compliance.   Will refill as needed for now

## 2017-05-14 ENCOUNTER — Ambulatory Visit: Payer: BLUE CROSS/BLUE SHIELD | Admitting: Internal Medicine

## 2017-05-29 ENCOUNTER — Other Ambulatory Visit: Payer: Self-pay | Admitting: *Deleted

## 2017-06-03 ENCOUNTER — Other Ambulatory Visit: Payer: Self-pay | Admitting: Internal Medicine

## 2017-06-26 ENCOUNTER — Other Ambulatory Visit: Payer: Self-pay | Admitting: Internal Medicine

## 2017-07-02 ENCOUNTER — Ambulatory Visit (INDEPENDENT_AMBULATORY_CARE_PROVIDER_SITE_OTHER): Payer: BLUE CROSS/BLUE SHIELD | Admitting: Pharmacist Clinician (PhC)/ Clinical Pharmacy Specialist

## 2017-07-02 ENCOUNTER — Telehealth: Payer: Self-pay | Admitting: Pharmacist Clinician (PhC)/ Clinical Pharmacy Specialist

## 2017-07-02 DIAGNOSIS — Z7901 Long term (current) use of anticoagulants: Secondary | ICD-10-CM

## 2017-07-02 DIAGNOSIS — I481 Persistent atrial fibrillation: Secondary | ICD-10-CM | POA: Diagnosis not present

## 2017-07-02 DIAGNOSIS — I4819 Other persistent atrial fibrillation: Secondary | ICD-10-CM

## 2017-07-02 LAB — POCT INR: INR: 2

## 2017-07-06 NOTE — Telephone Encounter (Signed)
Opened in error

## 2017-07-13 ENCOUNTER — Other Ambulatory Visit: Payer: Self-pay | Admitting: Internal Medicine

## 2017-07-21 ENCOUNTER — Other Ambulatory Visit: Payer: Self-pay | Admitting: *Deleted

## 2017-07-21 MED ORDER — DILTIAZEM HCL ER COATED BEADS 240 MG PO CP24: 240.0000 mg | ORAL_CAPSULE | Freq: Every day | ORAL | 1 refills | Status: DC

## 2017-08-05 ENCOUNTER — Other Ambulatory Visit: Payer: Self-pay

## 2017-08-06 MED ORDER — WARFARIN SODIUM 5 MG PO TABS: ORAL_TABLET | ORAL | 0 refills | Status: DC

## 2017-09-01 DIAGNOSIS — F918 Other conduct disorders: Secondary | ICD-10-CM | POA: Diagnosis not present

## 2017-09-01 DIAGNOSIS — R45850 Homicidal ideations: Secondary | ICD-10-CM | POA: Diagnosis not present

## 2017-09-18 ENCOUNTER — Other Ambulatory Visit: Payer: Self-pay | Admitting: Internal Medicine

## 2017-10-02 ENCOUNTER — Ambulatory Visit (INDEPENDENT_AMBULATORY_CARE_PROVIDER_SITE_OTHER): Payer: Self-pay | Admitting: Pharmacist

## 2017-10-02 DIAGNOSIS — Z7901 Long term (current) use of anticoagulants: Secondary | ICD-10-CM

## 2017-10-02 DIAGNOSIS — I481 Persistent atrial fibrillation: Secondary | ICD-10-CM

## 2017-10-02 DIAGNOSIS — I4819 Other persistent atrial fibrillation: Secondary | ICD-10-CM

## 2017-10-02 LAB — POCT INR: INR: 2.1

## 2017-10-02 MED ORDER — WARFARIN SODIUM 5 MG PO TABS: ORAL_TABLET | ORAL | 0 refills | Status: DC

## 2017-11-02 ENCOUNTER — Other Ambulatory Visit: Payer: Self-pay

## 2017-11-02 MED ORDER — SPIRONOLACTONE 25 MG PO TABS: 25.0000 mg | ORAL_TABLET | Freq: Every day | ORAL | 5 refills | Status: DC

## 2017-11-08 ENCOUNTER — Other Ambulatory Visit: Payer: Self-pay | Admitting: Internal Medicine

## 2017-11-22 ENCOUNTER — Other Ambulatory Visit: Payer: Self-pay | Admitting: Internal Medicine

## 2017-11-23 NOTE — Telephone Encounter (Signed)
REFILL 

## 2017-12-20 ENCOUNTER — Other Ambulatory Visit: Payer: Self-pay | Admitting: Internal Medicine

## 2018-01-08 ENCOUNTER — Other Ambulatory Visit: Payer: Self-pay | Admitting: Internal Medicine

## 2018-01-08 NOTE — Telephone Encounter (Signed)
Rx request sent to pharmacy.  

## 2018-02-18 ENCOUNTER — Other Ambulatory Visit: Payer: Self-pay | Admitting: Internal Medicine

## 2018-03-05 ENCOUNTER — Ambulatory Visit (INDEPENDENT_AMBULATORY_CARE_PROVIDER_SITE_OTHER): Payer: BLUE CROSS/BLUE SHIELD | Admitting: Pharmacist

## 2018-03-05 ENCOUNTER — Other Ambulatory Visit: Payer: Self-pay | Admitting: Internal Medicine

## 2018-03-05 DIAGNOSIS — Z7901 Long term (current) use of anticoagulants: Secondary | ICD-10-CM

## 2018-03-05 DIAGNOSIS — I4819 Other persistent atrial fibrillation: Secondary | ICD-10-CM

## 2018-03-05 DIAGNOSIS — I481 Persistent atrial fibrillation: Secondary | ICD-10-CM

## 2018-03-05 LAB — POCT INR: INR: 1.5 — AB (ref 2.0–3.0)

## 2018-03-05 MED ORDER — WARFARIN SODIUM 5 MG PO TABS: ORAL_TABLET | ORAL | 0 refills | Status: DC

## 2018-03-27 ENCOUNTER — Other Ambulatory Visit: Payer: Self-pay | Admitting: Internal Medicine

## 2018-04-11 ENCOUNTER — Other Ambulatory Visit: Payer: Self-pay | Admitting: Internal Medicine

## 2018-05-07 ENCOUNTER — Other Ambulatory Visit: Payer: Self-pay | Admitting: Internal Medicine

## 2018-05-13 ENCOUNTER — Other Ambulatory Visit: Payer: Self-pay | Admitting: Internal Medicine

## 2018-05-14 NOTE — Telephone Encounter (Signed)
Rx request sent to pharmacy.  

## 2018-05-24 ENCOUNTER — Other Ambulatory Visit: Payer: Self-pay | Admitting: Internal Medicine

## 2018-05-25 ENCOUNTER — Other Ambulatory Visit: Payer: Self-pay | Admitting: Pharmacist

## 2018-05-25 MED ORDER — WARFARIN SODIUM 5 MG PO TABS: ORAL_TABLET | ORAL | 0 refills | Status: DC

## 2018-05-28 ENCOUNTER — Telehealth: Payer: Self-pay | Admitting: Pharmacist

## 2018-05-28 NOTE — Telephone Encounter (Signed)
INR overdue but patient is sick with flu and unable to come.   Will call back next week to schedule f/u INR

## 2018-06-01 ENCOUNTER — Other Ambulatory Visit: Payer: Self-pay | Admitting: Internal Medicine

## 2018-06-05 ENCOUNTER — Other Ambulatory Visit: Payer: Self-pay | Admitting: Internal Medicine

## 2018-06-08 NOTE — Telephone Encounter (Signed)
Rx request sent to pharmacy.  

## 2018-06-16 DIAGNOSIS — Z23 Encounter for immunization: Secondary | ICD-10-CM | POA: Diagnosis not present

## 2018-06-16 DIAGNOSIS — I872 Venous insufficiency (chronic) (peripheral): Secondary | ICD-10-CM | POA: Diagnosis not present

## 2018-06-16 DIAGNOSIS — Z1389 Encounter for screening for other disorder: Secondary | ICD-10-CM | POA: Diagnosis not present

## 2018-06-16 DIAGNOSIS — M1A0790 Idiopathic chronic gout, unspecified ankle and foot, without tophus (tophi): Secondary | ICD-10-CM | POA: Diagnosis not present

## 2018-06-16 DIAGNOSIS — R7301 Impaired fasting glucose: Secondary | ICD-10-CM | POA: Diagnosis not present

## 2018-06-16 DIAGNOSIS — Z7901 Long term (current) use of anticoagulants: Secondary | ICD-10-CM | POA: Diagnosis not present

## 2018-06-16 DIAGNOSIS — I87319 Chronic venous hypertension (idiopathic) with ulcer of unspecified lower extremity: Secondary | ICD-10-CM | POA: Diagnosis not present

## 2018-06-16 LAB — POCT INR: INR: 1.2 — AB (ref 2.0–3.0)

## 2018-06-17 ENCOUNTER — Ambulatory Visit (INDEPENDENT_AMBULATORY_CARE_PROVIDER_SITE_OTHER): Payer: BLUE CROSS/BLUE SHIELD | Admitting: Pharmacist Clinician (PhC)/ Clinical Pharmacy Specialist

## 2018-06-17 ENCOUNTER — Other Ambulatory Visit: Payer: Self-pay | Admitting: Pharmacist Clinician (PhC)/ Clinical Pharmacy Specialist

## 2018-06-17 DIAGNOSIS — I4819 Other persistent atrial fibrillation: Secondary | ICD-10-CM

## 2018-06-17 MED ORDER — WARFARIN SODIUM 5 MG PO TABS: ORAL_TABLET | ORAL | 0 refills | Status: AC

## 2018-06-22 DIAGNOSIS — I87319 Chronic venous hypertension (idiopathic) with ulcer of unspecified lower extremity: Secondary | ICD-10-CM | POA: Diagnosis not present

## 2018-06-28 DIAGNOSIS — I10 Essential (primary) hypertension: Secondary | ICD-10-CM | POA: Diagnosis not present

## 2018-06-28 DIAGNOSIS — I872 Venous insufficiency (chronic) (peripheral): Secondary | ICD-10-CM | POA: Diagnosis not present

## 2018-06-28 DIAGNOSIS — I509 Heart failure, unspecified: Secondary | ICD-10-CM | POA: Diagnosis not present

## 2018-06-29 ENCOUNTER — Other Ambulatory Visit: Payer: Self-pay | Admitting: Internal Medicine

## 2018-07-08 ENCOUNTER — Other Ambulatory Visit: Payer: Self-pay | Admitting: *Deleted

## 2018-07-08 MED ORDER — DILTIAZEM HCL ER COATED BEADS 240 MG PO CP24: 240.0000 mg | ORAL_CAPSULE | Freq: Every day | ORAL | 0 refills | Status: DC

## 2018-07-12 ENCOUNTER — Telehealth: Payer: Self-pay | Admitting: Internal Medicine

## 2018-07-12 MED ORDER — FUROSEMIDE 40 MG PO TABS: ORAL_TABLET | ORAL | 0 refills | Status: DC

## 2018-07-12 MED ORDER — CARVEDILOL 12.5 MG PO TABS: 12.5000 mg | ORAL_TABLET | Freq: Two times a day (BID) | ORAL | 0 refills | Status: DC

## 2018-07-12 MED ORDER — SPIRONOLACTONE 25 MG PO TABS: ORAL_TABLET | ORAL | 0 refills | Status: DC

## 2018-07-12 MED ORDER — DILTIAZEM HCL ER COATED BEADS 240 MG PO CP24: 240.0000 mg | ORAL_CAPSULE | Freq: Every day | ORAL | 0 refills | Status: DC

## 2018-07-12 NOTE — Telephone Encounter (Signed)
New message       *STAT* If patient is at the pharmacy, call can be transferred to refill team.   1. Which medications need to be refilled? (please list name of each medication and dose if known) carvedilol (COREG) 12.5 MG tablet  furosemide (LASIX) 40 MG tablet  diltiazem (CARDIZEM CD) 240 MG 24 hr capsule  spironolactone (ALDACTONE) 25 MG tablet  2. Which pharmacy/location (including street and city if local pharmacy) is medication to be sent to?[id number] [place], [place] - [address]  3. Do they need a 30 day or 90 day supply? 30

## 2018-07-12 NOTE — Telephone Encounter (Signed)
Per Dr. Hessie Knows, [facility], ok to fill pt's meds requested for 30 days.  Pt has an appt 07/28/18.

## 2018-07-14 ENCOUNTER — Ambulatory Visit: Payer: Self-pay

## 2018-07-14 NOTE — Progress Notes (Signed)
Scheduled pt for overdue inr appt conjoined same day he is seeing meng 07/28/18

## 2018-07-19 DIAGNOSIS — I872 Venous insufficiency (chronic) (peripheral): Secondary | ICD-10-CM | POA: Diagnosis not present

## 2018-07-19 DIAGNOSIS — I10 Essential (primary) hypertension: Secondary | ICD-10-CM | POA: Diagnosis not present

## 2018-07-19 DIAGNOSIS — Z7901 Long term (current) use of anticoagulants: Secondary | ICD-10-CM | POA: Diagnosis not present

## 2018-07-19 DIAGNOSIS — I480 Paroxysmal atrial fibrillation: Secondary | ICD-10-CM | POA: Diagnosis not present

## 2018-07-28 ENCOUNTER — Ambulatory Visit: Payer: BLUE CROSS/BLUE SHIELD | Admitting: Physician Assistant

## 2018-08-04 ENCOUNTER — Other Ambulatory Visit: Payer: Self-pay | Admitting: Internal Medicine

## 2018-08-05 ENCOUNTER — Other Ambulatory Visit: Payer: Self-pay | Admitting: Internal Medicine

## 2018-08-18 ENCOUNTER — Other Ambulatory Visit: Payer: Self-pay | Admitting: Internal Medicine

## 2018-08-27 ENCOUNTER — Telehealth: Payer: Self-pay

## 2018-08-27 ENCOUNTER — Ambulatory Visit: Payer: Self-pay

## 2018-08-27 ENCOUNTER — Other Ambulatory Visit: Payer: Self-pay | Admitting: Internal Medicine

## 2018-08-27 NOTE — Telephone Encounter (Signed)
Called to schedule overdue inr pt stated that they are followed by pcp at [facility]

## 2018-08-27 NOTE — Progress Notes (Signed)
Pt stated followed by [facility]

## 2018-08-27 NOTE — Telephone Encounter (Signed)
Rx request sent to pharmacy.  

## 2018-09-01 ENCOUNTER — Other Ambulatory Visit: Payer: Self-pay | Admitting: Internal Medicine

## 2018-09-01 NOTE — Telephone Encounter (Signed)
Rx request sent to pharmacy.  

## 2018-09-07 ENCOUNTER — Other Ambulatory Visit: Payer: Self-pay | Admitting: Internal Medicine

## 2018-09-08 NOTE — Telephone Encounter (Signed)
Rx(s) sent to pharmacy electronically.  

## 2018-09-08 NOTE — Telephone Encounter (Signed)
Please advise if ok to refill Diltiazem. Pt has hx of cancellation with Refills. Pt has f/u 09/21/2018 with [facility].

## 2018-09-13 ENCOUNTER — Other Ambulatory Visit: Payer: Self-pay | Admitting: Internal Medicine

## 2018-09-16 DIAGNOSIS — G4700 Insomnia, unspecified: Secondary | ICD-10-CM | POA: Diagnosis not present

## 2018-09-16 DIAGNOSIS — I480 Paroxysmal atrial fibrillation: Secondary | ICD-10-CM | POA: Diagnosis not present

## 2018-09-16 DIAGNOSIS — Z7901 Long term (current) use of anticoagulants: Secondary | ICD-10-CM | POA: Diagnosis not present

## 2018-09-16 DIAGNOSIS — I872 Venous insufficiency (chronic) (peripheral): Secondary | ICD-10-CM | POA: Diagnosis not present

## 2018-09-21 ENCOUNTER — Encounter: Payer: Self-pay | Admitting: Internal Medicine

## 2018-09-21 ENCOUNTER — Ambulatory Visit: Payer: BLUE CROSS/BLUE SHIELD | Admitting: Internal Medicine

## 2018-09-21 VITALS — BP 148/91 | HR 96 | Ht 72.0 in | Wt >= 6400 oz

## 2018-09-21 DIAGNOSIS — Z6841 Body Mass Index (BMI) 40.0 and over, adult: Secondary | ICD-10-CM

## 2018-09-21 DIAGNOSIS — I4819 Other persistent atrial fibrillation: Secondary | ICD-10-CM

## 2018-09-21 DIAGNOSIS — Z7901 Long term (current) use of anticoagulants: Secondary | ICD-10-CM

## 2018-09-21 DIAGNOSIS — I428 Other cardiomyopathies: Secondary | ICD-10-CM | POA: Diagnosis not present

## 2018-09-21 MED ORDER — FUROSEMIDE 40 MG PO TABS: ORAL_TABLET | ORAL | 11 refills | Status: DC
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
## 2018-09-21 NOTE — Patient Instructions (Signed)

## 2018-09-25 ENCOUNTER — Other Ambulatory Visit: Payer: Self-pay | Admitting: Internal Medicine

## 2018-10-10 ENCOUNTER — Other Ambulatory Visit: Payer: Self-pay | Admitting: Internal Medicine

## 2018-11-01 ENCOUNTER — Other Ambulatory Visit: Payer: Self-pay | Admitting: Internal Medicine

## 2018-11-11 LAB — PROTIME-INR: INR: 2.9 — AB (ref 0.9–1.1)

## 2018-12-06 ENCOUNTER — Other Ambulatory Visit: Payer: Self-pay

## 2018-12-06 MED ORDER — RAMIPRIL 5 MG PO CAPS: 5.0000 mg | ORAL_CAPSULE | Freq: Every day | ORAL | 3 refills | Status: AC

## 2018-12-31 DIAGNOSIS — Z7901 Long term (current) use of anticoagulants: Secondary | ICD-10-CM | POA: Diagnosis not present

## 2018-12-31 DIAGNOSIS — I480 Paroxysmal atrial fibrillation: Secondary | ICD-10-CM | POA: Diagnosis not present

## 2019-01-07 DIAGNOSIS — Z7901 Long term (current) use of anticoagulants: Secondary | ICD-10-CM | POA: Diagnosis not present

## 2019-01-07 DIAGNOSIS — I480 Paroxysmal atrial fibrillation: Secondary | ICD-10-CM | POA: Diagnosis not present

## 2019-01-20 DIAGNOSIS — I10 Essential (primary) hypertension: Secondary | ICD-10-CM | POA: Diagnosis not present

## 2019-01-20 DIAGNOSIS — R0600 Dyspnea, unspecified: Secondary | ICD-10-CM | POA: Diagnosis not present

## 2019-01-20 DIAGNOSIS — Z79899 Other long term (current) drug therapy: Secondary | ICD-10-CM | POA: Diagnosis not present

## 2019-01-20 DIAGNOSIS — I517 Cardiomegaly: Secondary | ICD-10-CM | POA: Diagnosis not present

## 2019-01-20 DIAGNOSIS — R918 Other nonspecific abnormal finding of lung field: Secondary | ICD-10-CM | POA: Diagnosis not present

## 2019-01-20 DIAGNOSIS — Z03818 Encounter for observation for suspected exposure to other biological agents ruled out: Secondary | ICD-10-CM | POA: Diagnosis not present

## 2019-01-20 DIAGNOSIS — N179 Acute kidney failure, unspecified: Secondary | ICD-10-CM | POA: Diagnosis not present

## 2019-01-20 DIAGNOSIS — Z20828 Contact with and (suspected) exposure to other viral communicable diseases: Secondary | ICD-10-CM | POA: Diagnosis not present

## 2019-01-20 DIAGNOSIS — R0602 Shortness of breath: Secondary | ICD-10-CM | POA: Diagnosis not present

## 2019-01-20 DIAGNOSIS — R0902 Hypoxemia: Secondary | ICD-10-CM | POA: Diagnosis not present

## 2019-01-21 ENCOUNTER — Encounter (HOSPITAL_COMMUNITY): Payer: Self-pay | Admitting: Family Medicine

## 2019-01-21 ENCOUNTER — Inpatient Hospital Stay (HOSPITAL_COMMUNITY): Payer: 59

## 2019-01-21 ENCOUNTER — Other Ambulatory Visit: Payer: Self-pay

## 2019-01-21 ENCOUNTER — Inpatient Hospital Stay (HOSPITAL_COMMUNITY)
Admission: AD | Admit: 2019-01-21 | Discharge: 2019-01-25 | DRG: 291 | Disposition: A | Discharge status: Home / Self Care | Payer: 59 | Source: Other Acute Inpatient Hospital | Attending: Internal Medicine | Admitting: Internal Medicine

## 2019-01-21 DIAGNOSIS — Z1159 Encounter for screening for other viral diseases: Secondary | ICD-10-CM | POA: Diagnosis not present

## 2019-01-21 DIAGNOSIS — E8770 Fluid overload, unspecified: Secondary | ICD-10-CM | POA: Diagnosis not present

## 2019-01-21 DIAGNOSIS — I110 Hypertensive heart disease with heart failure: Secondary | ICD-10-CM | POA: Diagnosis not present

## 2019-01-21 DIAGNOSIS — G4733 Obstructive sleep apnea (adult) (pediatric): Secondary | ICD-10-CM | POA: Diagnosis not present

## 2019-01-21 DIAGNOSIS — Z9119 Patient's noncompliance with other medical treatment and regimen: Secondary | ICD-10-CM

## 2019-01-21 DIAGNOSIS — I4821 Permanent atrial fibrillation: Secondary | ICD-10-CM | POA: Diagnosis present

## 2019-01-21 DIAGNOSIS — R778 Other specified abnormalities of plasma proteins: Secondary | ICD-10-CM | POA: Diagnosis present

## 2019-01-21 DIAGNOSIS — J9602 Acute respiratory failure with hypercapnia: Secondary | ICD-10-CM

## 2019-01-21 DIAGNOSIS — I429 Cardiomyopathy, unspecified: Secondary | ICD-10-CM

## 2019-01-21 DIAGNOSIS — J9622 Acute and chronic respiratory failure with hypercapnia: Secondary | ICD-10-CM | POA: Diagnosis present

## 2019-01-21 DIAGNOSIS — Z7982 Long term (current) use of aspirin: Secondary | ICD-10-CM | POA: Diagnosis not present

## 2019-01-21 DIAGNOSIS — N179 Acute kidney failure, unspecified: Secondary | ICD-10-CM | POA: Diagnosis not present

## 2019-01-21 DIAGNOSIS — Z7901 Long term (current) use of anticoagulants: Secondary | ICD-10-CM | POA: Diagnosis not present

## 2019-01-21 DIAGNOSIS — Z87891 Personal history of nicotine dependence: Secondary | ICD-10-CM | POA: Diagnosis not present

## 2019-01-21 DIAGNOSIS — I890 Lymphedema, not elsewhere classified: Secondary | ICD-10-CM | POA: Diagnosis present

## 2019-01-21 DIAGNOSIS — M109 Gout, unspecified: Secondary | ICD-10-CM | POA: Diagnosis present

## 2019-01-21 DIAGNOSIS — Z79899 Other long term (current) drug therapy: Secondary | ICD-10-CM

## 2019-01-21 DIAGNOSIS — E662 Morbid (severe) obesity with alveolar hypoventilation: Secondary | ICD-10-CM | POA: Diagnosis not present

## 2019-01-21 DIAGNOSIS — J9601 Acute respiratory failure with hypoxia: Secondary | ICD-10-CM | POA: Diagnosis not present

## 2019-01-21 DIAGNOSIS — Z6841 Body Mass Index (BMI) 40.0 and over, adult: Secondary | ICD-10-CM

## 2019-01-21 DIAGNOSIS — I5043 Acute on chronic combined systolic (congestive) and diastolic (congestive) heart failure: Secondary | ICD-10-CM | POA: Diagnosis present

## 2019-01-21 DIAGNOSIS — J9621 Acute and chronic respiratory failure with hypoxia: Secondary | ICD-10-CM | POA: Diagnosis present

## 2019-01-21 DIAGNOSIS — R791 Abnormal coagulation profile: Secondary | ICD-10-CM | POA: Diagnosis present

## 2019-01-21 DIAGNOSIS — I959 Hypotension, unspecified: Secondary | ICD-10-CM | POA: Diagnosis not present

## 2019-01-21 DIAGNOSIS — I10 Essential (primary) hypertension: Secondary | ICD-10-CM | POA: Diagnosis present

## 2019-01-21 DIAGNOSIS — I5023 Acute on chronic systolic (congestive) heart failure: Secondary | ICD-10-CM | POA: Diagnosis not present

## 2019-01-21 DIAGNOSIS — R4020 Unspecified coma: Secondary | ICD-10-CM | POA: Diagnosis not present

## 2019-01-21 DIAGNOSIS — R7989 Other specified abnormal findings of blood chemistry: Secondary | ICD-10-CM | POA: Diagnosis not present

## 2019-01-21 DIAGNOSIS — G9341 Metabolic encephalopathy: Secondary | ICD-10-CM | POA: Diagnosis not present

## 2019-01-21 DIAGNOSIS — N289 Disorder of kidney and ureter, unspecified: Secondary | ICD-10-CM

## 2019-01-21 DIAGNOSIS — Z9114 Patient's other noncompliance with medication regimen: Secondary | ICD-10-CM

## 2019-01-21 DIAGNOSIS — I878 Other specified disorders of veins: Secondary | ICD-10-CM | POA: Diagnosis present

## 2019-01-21 DIAGNOSIS — N390 Urinary tract infection, site not specified: Secondary | ICD-10-CM | POA: Diagnosis present

## 2019-01-21 DIAGNOSIS — R0902 Hypoxemia: Secondary | ICD-10-CM | POA: Diagnosis not present

## 2019-01-21 DIAGNOSIS — I951 Orthostatic hypotension: Secondary | ICD-10-CM | POA: Diagnosis not present

## 2019-01-21 DIAGNOSIS — I428 Other cardiomyopathies: Secondary | ICD-10-CM | POA: Diagnosis not present

## 2019-01-21 HISTORY — DX: Heart failure, unspecified: I50.9

## 2019-01-21 LAB — BASIC METABOLIC PANEL
Anion gap: 12 (ref 5–15)
BUN: 42 mg/dL — ABNORMAL HIGH (ref 6–20)
CO2: 33 mmol/L — ABNORMAL HIGH (ref 22–32)
Calcium: 9.1 mg/dL (ref 8.9–10.3)
Chloride: 93 mmol/L — ABNORMAL LOW (ref 98–111)
Creatinine, Ser: 2.35 mg/dL — ABNORMAL HIGH (ref 0.61–1.24)
GFR calc Af Amer: 35 mL/min — ABNORMAL LOW (ref >60)
GFR calc non Af Amer: 30 mL/min — ABNORMAL LOW (ref >60)
Glucose, Bld: 101 mg/dL — ABNORMAL HIGH (ref 70–99)
Potassium: 4.9 mmol/L (ref 3.5–5.1)
Sodium: 138 mmol/L (ref 135–145)

## 2019-01-21 LAB — CBC
HCT: 45 % (ref 39.0–52.0)
Hemoglobin: 14.1 g/dL (ref 13.0–17.0)
MCH: 32.1 pg (ref 26.0–34.0)
MCHC: 31.3 g/dL (ref 30.0–36.0)
MCV: 102.5 fL — ABNORMAL HIGH (ref 80.0–100.0)
Platelets: 158 10*3/uL (ref 150–400)
RBC: 4.39 MIL/uL (ref 4.22–5.81)
RDW: 14.6 % (ref 11.5–15.5)
WBC: 7.2 10*3/uL (ref 4.0–10.5)
nRBC: 0.6 % — ABNORMAL HIGH (ref 0.0–0.2)

## 2019-01-21 LAB — CREATININE, URINE, RANDOM: Creatinine, Urine: 223.49 mg/dL

## 2019-01-21 LAB — SODIUM, URINE, RANDOM: Sodium, Ur: 26 mmol/L

## 2019-01-21 LAB — URINALYSIS, COMPLETE (UACMP) WITH MICROSCOPIC
Bilirubin Urine: NEGATIVE
Glucose, UA: NEGATIVE mg/dL
Hgb urine dipstick: NEGATIVE
Ketones, ur: NEGATIVE mg/dL
Nitrite: NEGATIVE
Protein, ur: 30 mg/dL — AB
Specific Gravity, Urine: 1.016 (ref 1.005–1.030)
WBC, UA: >50 WBC/hpf — ABNORMAL HIGH (ref 0–5)
pH: 5 (ref 5.0–8.0)

## 2019-01-21 LAB — SARS CORONAVIRUS 2 BY RT PCR (HOSPITAL ORDER, PERFORMED IN ~~LOC~~ HOSPITAL LAB): SARS Coronavirus 2: NEGATIVE

## 2019-01-21 LAB — PROTIME-INR
INR: 1.6 — ABNORMAL HIGH (ref 0.8–1.2)
Prothrombin Time: 18.7 seconds — ABNORMAL HIGH (ref 11.4–15.2)

## 2019-01-21 LAB — HIV ANTIBODY (ROUTINE TESTING W REFLEX): HIV Screen 4th Generation wRfx: NONREACTIVE

## 2019-01-21 LAB — LACTIC ACID, PLASMA: Lactic Acid, Venous: 0.9 mmol/L (ref 0.5–1.9)

## 2019-01-21 LAB — PROCALCITONIN: Procalcitonin: <0.1 ng/mL

## 2019-01-21 LAB — ECHOCARDIOGRAM COMPLETE
Height: 72 in
Weight: 7803.2 oz

## 2019-01-21 MED ORDER — DILTIAZEM HCL ER COATED BEADS 240 MG PO CP24
240.0000 mg | ORAL_CAPSULE | Freq: Every day | ORAL | Status: DC
  Filled 2019-01-21: qty 1

## 2019-01-21 MED ORDER — WARFARIN SODIUM 7.5 MG PO TABS
7.5000 mg | ORAL_TABLET | Freq: Once | ORAL | Status: AC
  Administered 2019-01-21: 7.5 mg via ORAL
  Filled 2019-01-21: qty 1

## 2019-01-21 MED ORDER — SODIUM CHLORIDE 0.9% FLUSH
3.0000 mL | Freq: Two times a day (BID) | INTRAVENOUS | Status: DC
  Administered 2019-01-21 – 2019-01-24 (×7): 3 mL via INTRAVENOUS

## 2019-01-21 MED ORDER — ONDANSETRON HCL 4 MG/2ML IJ SOLN: 4.0000 mg | Freq: Four times a day (QID) | INTRAMUSCULAR | Status: DC | PRN

## 2019-01-21 MED ORDER — SODIUM CHLORIDE 0.9 % IV SOLN
1.0000 g | INTRAVENOUS | Status: DC
  Administered 2019-01-21 – 2019-01-24 (×4): 1 g via INTRAVENOUS
  Filled 2019-01-21 (×4): qty 10

## 2019-01-21 MED ORDER — PERFLUTREN LIPID MICROSPHERE
1.0000 mL | INTRAVENOUS | Status: AC | PRN
  Administered 2019-01-21: 2 mL via INTRAVENOUS
  Filled 2019-01-21: qty 10

## 2019-01-21 MED ORDER — ACETAMINOPHEN 325 MG PO TABS: 650.0000 mg | ORAL_TABLET | ORAL | Status: DC | PRN

## 2019-01-21 MED ORDER — TRAZODONE HCL 50 MG PO TABS
50.0000 mg | ORAL_TABLET | Freq: Every evening | ORAL | Status: DC | PRN
  Administered 2019-01-22 – 2019-01-25 (×2): 50 mg via ORAL
  Filled 2019-01-21 (×2): qty 1

## 2019-01-21 MED ORDER — SODIUM CHLORIDE 0.9% FLUSH: 3.0000 mL | INTRAVENOUS | Status: DC | PRN

## 2019-01-21 MED ORDER — FUROSEMIDE 10 MG/ML IJ SOLN: 80.0000 mg | Freq: Two times a day (BID) | INTRAMUSCULAR | Status: DC

## 2019-01-21 MED ORDER — ASPIRIN EC 81 MG PO TBEC
81.0000 mg | DELAYED_RELEASE_TABLET | Freq: Every day | ORAL | Status: DC
  Administered 2019-01-21 – 2019-01-25 (×5): 81 mg via ORAL
  Filled 2019-01-21 (×5): qty 1

## 2019-01-21 MED ORDER — WARFARIN - PHARMACIST DOSING INPATIENT
Freq: Every day | Status: DC
  Administered 2019-01-23: 18:00:00

## 2019-01-21 MED ORDER — SODIUM CHLORIDE 0.9 % IV SOLN
250.0000 mL | INTRAVENOUS | Status: DC | PRN
  Administered 2019-01-21: 250 mL via INTRAVENOUS

## 2019-01-21 MED ORDER — SODIUM CHLORIDE 0.9 % IV SOLN
500.0000 mg | INTRAVENOUS | Status: DC
  Administered 2019-01-21: 500 mg via INTRAVENOUS
  Filled 2019-01-21: qty 500

## 2019-01-21 MED ORDER — CARVEDILOL 12.5 MG PO TABS
12.5000 mg | ORAL_TABLET | Freq: Two times a day (BID) | ORAL | Status: DC
  Filled 2019-01-21: qty 1

## 2019-01-21 MED ORDER — SODIUM CHLORIDE 0.9 % IV BOLUS
250.0000 mL | Freq: Once | INTRAVENOUS | Status: AC
  Administered 2019-01-21: 250 mL via INTRAVENOUS

## 2019-01-21 NOTE — Significant Event (Signed)
Rapid Response Event Note  Overview:  2029: Called by RN stating pt with change in mental status. States he was hard to wake up and was unable to form sentences. VSS, CBG 113. I was unable to respond to call immediately d/t Rapid Response page to IR while on phone with RN. I instructed RN to page provider and call me back with any changes or updates.   2042: On arrival, pt resting in bed A&O, VSS, good movement and strength in bilateral extremities, PERRLA, NIH 0. Pt states he only remembers the end of the "episode" and not being able to speak clearly. RN on phone with provider on my arrival who ordered STAT head CT. RN and charge RN instructed to call CT to set up a transport time and to have RN accompany pt to CT.    Plan of Care (if not transferred):  2245: Checked back with RN who states pt is doing well, resting in bed, A&Ox4. CT head negative. RN instructed to call with any changes or concerns. Will continue to monitor neurological status.     Mordecai Rasmussen

## 2019-01-21 NOTE — Progress Notes (Signed)
RT placed pt on CPAP dream station for the night with 2 Lpm bled into the system on auto titrate 20 max 5 min. Pt tolerating well. RT will continue to monitor.

## 2019-01-21 NOTE — Consult Note (Addendum)
Cardiology Consultation:   Patient ID: Breyton Vanscyoc; [id number]; 05-Sep-1960   Admit date: 01/21/2019 Date of Consult: 01/21/2019  Primary Care Provider: Chilton Greathouse, MD Primary Cardiologist: Dr. Rennis Golden  Patient Profile:   Hamlet Lasecki is a 58 y.o. male with a hx of morbid obesity with a BMI >60, nonischemic cardiomyopathy now with improved LV function per echocardiogram, permanent atrial fibrillation on chronic Coumadin therapy, hypertension and gout who is being seen today for the evaluation of CHF exacerbation at the request of Dr. Sunnie Nielsen.  History of Present Illness:   Mr. Mollica is a 58 year old male with a history stated above who presented to [facility] on 01/21/2019 with complaints of mild shortness of breath.  Patient reported that he was in his usual state of health until several days ago when his wife became concerned as his fingertips, mouth and lips appeared blue in color while sleeping.  Patient reports that he was not feeling short of breath at the time however after several days, has become more dyspneic on minimal exertion. He bought a pulse oximeter and reported that his saturations one day prior to presentation was in the mid 70s.  He reported that he stopped taking his evening Lasix approximately 2 weeks ago because it was keeping him awake at night.  He reports that he is typically a poor sleeper and the Lasix had exacerbated this as he gets up to urinate 4-5 times per night. He reports compliance with his Coumadin and INR checks. He reports that his chronic lower extremity swelling has been unchanged during this time.  He wears compression stockings and compression sleeves for this.  He denies chest pain, palpitations, dizziness, orthopnea or syncope.  He was initially taken to [facility] ED and his O2 saturations were in the mid 80s on room air.  EKG showed atrial fibrillation which is chronic for him. CXR revealed cardiomegaly with mild bibasilar opacities.  His creatinine was  elevated at 2.31 and had been previously normal. His INR was subtherapeutic at 1.7.  Initial troponin was elevated at 0.06.  His proBNP was markedly elevated at 3351.  He was treated with IV Lasix, Rocephin and azithromycin in the emergency room and transferred to [facility] for further management.  Of note, he is a Cytogeneticist of a music shop and has a fairly sedentary lifestyle. He is morbidly obese with a BMI >60. He has a hx of nonischemic cardiomyopathy and had normal coronaries by catheterization in 2009. At that time his EF was 20-25% in the setting of atrial fibrillation with rapid ventricular response. A TEE cardioversion attempt was performed but failed. Ultimately he was placed on amiodarone but never went to have any other cardioversion attempt. He was also started on warfarin however he has been noncompliant with followup. A repeat echocardiogram was performed which showed improvement in his LV function up to 55%.  There was discussion regarding discontinuation of his digoxin as well as his amiodarone. A sleep study was recommended but never performed given patient statement of improved sleeping habits.  He was last seen by his primary cardiologist on 09/21/2018 in follow-up. At that time, his weight had not significantly reduced from previous weights.  He reported that his LE swelling was stable on higher doses of diuretics.  His BP was elevated and he remained in atrial fibrillation with rate control.  He continued to be on Coumadin however his INR was noted to not be followed at [facility].  Past Medical History:  Diagnosis Date  Atrial fibrillation, permanent    Failed DCCV 04/2008   CHF (congestive heart failure) (HCC)    Chronic anticoagulation    on coumadin   Dysrhythmia    A FIB - FOLLOWED BY DR. HILTY   History of gout    Hypertension    Morbid obesity with BMI of 60.0-69.9, adult (HCC)    Nonischemic cardiomyopathy (HCC)     Last Echo 01/28/2008, EF 20%,  last cardiac cath 2009 ( Lt & Rt) with normal cors   OSA (obstructive sleep apnea)    HAS NOT BEEN SET UP WITH C-PAP YET     Past Surgical History:  Procedure Laterality Date   CARDIAC CATHETERIZATION  01/31/2008   patent cors, CO/CI 4.8/1.6, PA 65/33 mean 43; PCW 35/41, mean 35; Ra 31/35; RV mean31   CARDIOVERSION  may 2009   LAPAROSCOPIC GASTRIC SLEEVE RESECTION N/A 07/30/2015   Procedure: LAPAROSCOPIC GASTRIC SLEEVE RESECTION WITH UPPER ENDOSCOPY;  Surgeon: De Blanch Kinsinger, MD;  Location: [facility];  Service: General;  Laterality: N/A;     Prior to Admission medications   Medication Sig Start Date End Date Taking? Authorizing Provider  allopurinol (ZYLOPRIM) 300 MG tablet Take 1 tablet (300 mg total) by mouth daily. 10/27/14  Yes Rai, Ripudeep K, MD  aspirin EC 81 MG tablet Take 81 mg by mouth daily.   Yes [provider]  carvedilol (COREG) 12.5 MG tablet Take 1 tablet (12.5 mg total) by mouth 2 (two) times daily with a meal. 10/12/18  Yes Hilty, Lisette Abu, MD  diltiazem (CARDIZEM CD) 240 MG 24 hr capsule Take 1 capsule (240 mg total) by mouth daily. 10/12/18  Yes Hilty, Lisette Abu, MD  furosemide (LASIX) 40 MG tablet TAKE 2 TABLETS ( ) BY MOUTH IN THE AM AND 1 TABLET ( ) IN THE PM. 09/21/18  Yes Hilty, Lisette Abu, MD  ramipril (ALTACE) 5 MG capsule Take 1 capsule (5 mg total) by mouth daily. Please keep your upcoming appointment for refills. 12/06/18  Yes Hilty, Lisette Abu, MD  spironolactone (ALDACTONE) 25 MG tablet Take 1 tablet (25 mg total) by mouth daily. 11/03/18  Yes Hilty, Lisette Abu, MD  traZODone (DESYREL) 50 MG tablet Take 50-100 mg by mouth at bedtime as needed for sleep.  09/16/18  Yes [provider]  triamcinolone cream (KENALOG) 0.1 % Apply 1 application topically daily.  09/17/18  Yes [provider]  warfarin (COUMADIN) 5 MG tablet TAKE 1 TO 1 & 1/2 TABLETS BY MOUTH DAILY AS DIRECTED. NEED INR 06/17/18  Yes Hilty, Lisette Abu, MD  Multiple  Vitamin (MULTIVITAMIN) tablet Take 1 tablet by mouth daily.    [provider]    Inpatient Medications: Scheduled Meds:  aspirin EC  81 mg Oral Daily   sodium chloride flush  3 mL Intravenous Q12H   warfarin  7.5 mg Oral ONCE-1800   Warfarin - Pharmacist Dosing Inpatient   Does not apply q1800   Continuous Infusions:  sodium chloride 250 mL (01/21/19 1108)   azithromycin 500 mg (01/21/19 1113)   cefTRIAXone (ROCEPHIN)  IV     PRN Meds: sodium chloride, acetaminophen, ondansetron (ZOFRAN) IV, sodium chloride flush, traZODone  Allergies:   No Known Allergies  Social History:   Social History   Socioeconomic History   Marital status: Married    Spouse name: Not on file   Number of children: 1   Years of education: Not on file   Highest education level: Not on file  Occupational History  Occupation: Network engineer    Comment: [employer]  Ecologist strain: Not on file   Food insecurity:    Worry: Not on file    Inability: Not on file   Transportation needs:    Medical: Not on file    Non-medical: Not on file  Tobacco Use   Smoking status: Never Smoker   Smokeless tobacco: Former Neurosurgeon    Types: Chew  Substance and Sexual Activity   Alcohol use: No   Drug use: No   Sexual activity: Not on file  Lifestyle   Physical activity:    Days per week: Not on file    Minutes per session: Not on file   Stress: Not on file  Relationships   Social connections:    Talks on phone: Not on file    Gets together: Not on file    Attends religious service: Not on file    Active member of club or organization: Not on file    Attends meetings of clubs or organizations: Not on file    Relationship status: Not on file   Intimate partner violence:    Fear of current or ex partner: Not on file    Emotionally abused: Not on file    Physically abused: Not on file    Forced sexual activity: Not on file  Other Topics Concern   Not  on file  Social History Narrative   Not on file    Family History:   History reviewed. No pertinent family history. Family Status:  Family Status  Relation Name Status   MGM  Deceased   MGF  Deceased   PGM  Deceased   PGF  Deceased    ROS:  Please see the history of present illness.  All other ROS reviewed and negative.     Physical Exam/Data:   Vitals:   01/21/19 0806 01/21/19 0807 01/21/19 0814 01/21/19 1105  BP: (!) 86/53 (!) 77/43 (!) 78/50 (!) 100/53  Pulse: 87 87 81 94  Resp: Temp:   99.6 F (37.6 C) 98.3 F (36.8 C)  TempSrc:   Oral Oral  SpO2: 92% 92% 96% 95%  Weight:      Height:        Intake/Output Summary (Last 24 hours) at 01/21/2019 1208 Last data filed at 01/21/2019 1113 Gross per 24 hour  Intake 1031.63 ml  Output 200 ml  Net 831.63 ml   Filed Weights   01/21/19 0418  Weight: (!) 221.2 kg   Body mass index is 66.14 kg/m.   General: Morbid obesity, NAD Skin: Warm, dry, intact  Neck: Negative for carotid bruits. No JVD Lungs: Diminished bilaterally. No wheezes. Breathing is unlabored. Cardiovascular: Irregularly irregular. No murmurs, rubs, gallops, or LV heave appreciated. Abdomen: Soft, non-tender, distended. No obvious abdominal masses. Extremities: 2+ BLE edema with redness, no drainage. No cyanosis. DP/PT pulses 1+ bilaterally Neuro: Alert and oriented. No focal deficits. No facial asymmetry. MAE spontaneously. Psych: Responds to questions appropriately with normal affect.    EKG:  The EKG was personally reviewed and demonstrates: 01/21/2019 atrial fibrillation HR 101 Telemetry:  Telemetry was personally reviewed and demonstrates: 01/21/2019 atrial fibrillation HR 80-100  Relevant CV Studies:  ECHO: Pending   CATH: 2009:   LV angiogram in the RAO and LAO projection demonstrated severe global  hypokinesis.  There were no definite segmental wall motion abnormalities  and this was difficult to evaluate because the patient  was in atrial  fibrillation, EF was approximately 20-25%.  There was no significant  mitral regurgitation seen.   Hand injection of the abdominal aorta revealed single normal renal  arteries bilaterally and normal infrarenal abdominal aorta.   Fluoroscopy did not reveal any coronary, intracardiac, or valvular  calcification.   There was no gradient across the aortic valve on catheter pullback.   There was no gradient between simultaneous recorded LV and PCW wedge  pressures.   CORONARY ANGIOGRAPHY:  Main left coronary artery was short and normal.   The LAD coursed to the apex of the heart where it bifurcated.  Gave off  a large SP1 followed by a bifurcating large DX1 that were normal and  moderate from the proximal third of the LAD.  A normal small DX2 from  the proximal-mid LAD.  The LAD was widely patent and smooth throughout  and normal.   The circumflex was moderate size and nondominant.  A large OM1 and a  large trifurcating OM2 that were normal and a normal bifurcating PM PABG  branch.   The right coronary was a dominant vessel with a normal PDA, PLA, and  normal RV branches.  Widely patent and smooth throughout its course.  Laboratory Data:  Chemistry Recent Labs  Lab 01/21/19 0510  NA 138  K 4.9  CL 93*  CO2 33*  GLUCOSE 101*  BUN 42*  CREATININE 2.35*  CALCIUM 9.1  GFRNONAA 30*  GFRAA 35*  ANIONGAP 12    Total Protein  Date Value Ref Range Status  07/26/2015 7.8 6.5 - 8.1 g/dL Final   Albumin  Date Value Ref Range Status  07/26/2015 4.2 3.5 - 5.0 g/dL Final   AST  Date Value Ref Range Status  07/26/2015 38 15 - 41 U/L Final   ALT  Date Value Ref Range Status  07/26/2015 40 17 - 63 U/L Final   Alkaline Phosphatase  Date Value Ref Range Status  07/26/2015 48 38 - 126 U/L Final   Total Bilirubin  Date Value Ref Range Status  07/26/2015 0.7 0.3 - 1.2 mg/dL Final   Hematology Recent Labs  Lab 01/21/19 0811  WBC 7.2  RBC 4.39    HGB 14.1  HCT 45.0  MCV 102.5*  MCH 32.1  MCHC 31.3  RDW 14.6  PLT 158   Cardiac EnzymesNo results for input(s): TROPONINI in the last 168 hours. No results for input(s): TROPIPOC in the last 168 hours.  BNPNo results for input(s): BNP, PROBNP in the last 168 hours.  DDimer No results for input(s): DDIMER in the last 168 hours. TSH:  Lab Results  Component Value Date   TSH 2.438 Test methodology is 3rd generation TSH 01/27/2008   Lipids: Lab Results  Component Value Date   CHOL  01/29/2008    128        ATP III CLASSIFICATION:  <200     mg/dL   Desirable  546-568  mg/dL   Borderline High  >=127    mg/dL   High   HDL 22 (L) 51/70/0174   LDLCALC  01/29/2008    80        Total Cholesterol/HDL:CHD Risk Coronary Heart Disease Risk Table                     Men   Women  1/2 Average Risk   3.4   3.3   TRIG 132 01/29/2008   CHOLHDL 5.8 01/29/2008   HgbA1c: Lab Results  Component Value Date   HGBA1C  01/27/2008    5.9 (NOTE)   The ADA recommends the following therapeutic goals for glycemic   control related to Hgb A1C measurement:   Goal of Therapy:   < 7.0% Hgb A1C   Action Suggested:  > 8.0% Hgb A1C   Ref:  Diabetes Care, 22, Suppl. 1, 1999   Radiology/Studies:  No results found.  Assessment and Plan:   1.  Acute respiratory failure secondary to acute on chronic heart failure versus CAP: -[facility] progressively worsening hypoxia and shortness of breath after stopping his evening dose of Lasix 2 weeks prior to presentation secondary to frequent nocturnal urination -BNP on initial lab work was markedly elevated at 3351 with CXR revealing cardiomegaly and mild bibasilar opacities -He has a prior history of nonischemic cardiomyopathy from 2009 with an initial LVEF of 20-25% thought to be secondary to atrial fibrillation. He had a follow up echocardiogram in 2015 that showed grossly normal LV function.  -Repeat echo from this admission with pending  results. -IV Lasix currently on hold given that the patient is hypotensive>>he is currently asymptomatic  -Would recommend slow diuresis as he appears to be fluid volume overloaded on exam while being cautious of his low BP>>sympotms in the setting of morbid obesity due to excess weight on heart and lungs  -If unable to adequately diurese, could consider vasopressors for BP support to allow for more aggressive diuresis -IV Lasix, as well as antihypertensives currently on hold secondary to above -Also with AKI on presentation with a creatinine of 2.35>>have called [facility] for more up to date BMET if available  -On home carvedilol 12.5, Lasix 80 in the AM 40 in the PM, ramipril 5, spironolactone 25  2.  Persistent atrial fibrillation on Coumadin therapy: -Patient has known persistent/chronic atrial fibrillation datuing back many years -He has failed DCCV in the past and remains fairly asymptomatic with rate controlled AF -Initially controlled on digoxin and amiodarone however these have since been discontinued many years ago -Currently fairly well rate controlled without home carvedilol or diltiazem -Would continue to hold above given soft BP -On chronic coumadin therapy and has INR folled with [facility] -INR on presentation subtherapeutic at 1.2 -Coumadin dosed per pharmacy [id number] =2 (CHF and hypertension)  3.  History of nonischemic cardiomyopathy with improved LV function: -Cardiac catheterization from 2009 with non-ischemic cardiomyopathy in the setting of uncontrolled atrial fibrillation with an EF of 20-25%  -Repeat echocardiogram from 2015 with improved LV function to 55% -Fluid volume maintained on high dose diuretics with PO Lasix 80mg  in AM and 40mg  in PM  -Creatinine found to be elevated on presentation at 2.35 -Baseline appears to be in the 1.1-1.2 range however last results from 2016  4.  Hypertension: -Currently hypotensive,  100/53>78/50>77/43>86/53>102/59 -Antihypertensives on hold -Could consider pressor support if unable to adequately diurese given soft BPs -Patient is in no acute distress, mentating well  5. AKI: -Creatinine, 2.35 on presentation with a baseline in the 1.1-1.2 range from 2016. -ACEI and Lasix on hold  -[facility] more [facility] to [facility] BMET   6. OSA: -Reports that he is a poor sleeper however does report compliance with CPAP    7. Bilateral bibasilar opacities on CXR: -Treated with empiric IV antibiotics -No leukocytosis or fever   8.  Chronic lower extremity edema: -Has chronic lower extremity edema in the setting of venous stasis secondary to morbid obesity -  Continue with bilateral compression stockings/wraps -Reports good fluid volume management with Lasix 80 mg in a.m., 40 mg in p.m -Will need to follow BP cloesley.   For questions or updates, please contact [facility] Please consult [email or web address] for contact info under Cardiology/STEMI.   Raliegh Ip NP-C [facility] Pager: [phone] 01/21/2019 12:08 PM   History and all data above reviewed.  Patient examined.  I agree with the findings as above.  He has had long standing morbid obesity and has had bariatric surgery.  He has sleep apnea but is not wearing the CPAP.  He has had reduced his Lasix.  He comes in because his wife noticed that his fingernails were blue and his oxygen saturation was low.  He also is noted now to have renal insufficiency and I do not have a baseline creatinine.  He had previously reduced ejection fraction but the last echo several years ago it had returned to normal left ventricular function apparently.  He denies any acute shortness of breath though he does say he has probably gained about 15 pounds as near as he can tell.  He has increased leg swelling.  He chronically sleeps in a chair just for comfort and breathing and this is not new.  Is not had any chest  pressure, neck or arm discomfort.  He has not noticed any new palpitations, presyncope or syncope.  He said no fevers or chills though has had some mild cough.  The patient exam reveals COR:RRR  ,  Lungs: Clear  ,  Abd: Positive bowel sounds, no rebound no guarding, Ext Severe edema  .  All available labs, radiology testing, previous records reviewed. Agree with documented assessment and plan. Acute HF: The echo is still pending so I do not know his left ventricular function.  However, I think he has at least some component of hypoventilation obesity syndrome.  This makes diuresis difficult as he will be preload dependent and probably has some amount of RV dysfunction.  He and I had a long discussion about this and he understands quite clearly the physiology here.  He understands we need to get his legs wrapped and he has some apparent new wraps at home that he says worked very well he is going to bring these in.  He is going to keep his feet very elevated.  I think that once we keep his feet elevated and get his legs wrapped will be able to give him diuresis.  His blood pressure was very low today I think he would tolerate this.  Is also been in need to have treatment of his sleep apnea we had this discussion.  For now course for holding his beta-blocker and his ACE inhibitor.  An ultrasound of his kidneys is pending.  He is already asking about going home.  However, I agree that we can start diuresis until we get his legs wrapped so his first dose of diuretic that he might tolerate likely tomorrow.  We will wait on the results of the renal ultrasound and echocardiogram as well as follow-up creatinine.  Kaydon Addeline Calarco  2:39 PM  01/21/2019

## 2019-01-21 NOTE — Progress Notes (Signed)
Pt was asleep during shift change. Upon waking him up, pt started to [facility] and saying incoherent words,was able to say his complete name but he answered wrong about his Birthdate and age. He does not know were he is and constantly mumbling , does  Not remember that he took meds earlier.  He keeps repeating  Words/sound. BS was 113 VS as follows BP 105/65   Pulse (!) 105   Temp 99.8 F (37.7 C) (Oral)   Resp 17   Ht 6' (1.829 m)   Wt (!) 221.2 kg   SpO2 95%   BMI 66.14 kg/m    Noted that Pt was able to raise bilateral arms and legs. No drooping face noted. nop weakness noted,  Rapid response called and spoke to MD. CT of head ordered.   After few min, pt starting to become lucid. Able to answer questions correctly, A&O 4x. Denies complains.  Rapid RN nicky checked pt and MD as well. Will monitor.

## 2019-01-21 NOTE — Progress Notes (Signed)
PROGRESS NOTE    Meridith Treanor  LGX:[id number] DOB: 1961-04-03 DOA: 01/21/2019 PCP: Chilton Greathouse, MD    Brief Narrative: 58 year old with past medical history significant for obesity BMI more than 60, nonischemic cardiomyopathy, A. fib on Coumadin now presents complaining of shortness of breath.  Patient was noted to be hypoxic with oxygen sat measuring at home in the 70%.  He also stopped taking his evening basis.  He has not been compliant with INR monitoring.  At [facility] ED: Patient was found to be afebrile oxygen saturation in the low 80s on room air, tachycardic.  Chest x-ray showed cardiomegaly with mild bibasilar opacity.  Cr 2.3, previously normal.  proBNP 3351.  ABG pH 7.3 PCO2 68 PO2 79.  Assessment & Plan:   Principal Problem:   Acute on chronic respiratory failure with hypoxia and hypercapnia (HCC) Active Problems:   NICM (nonischemic cardiomyopathy) (HCC)   OSA (obstructive sleep apnea)   HTN (hypertension)   AKI (acute kidney injury) (HCC)   Renal insufficiency   Acute on chronic systolic CHF (congestive heart failure) (HCC)   Atrial fibrillation, permanent   Subtherapeutic international normalized ratio (INR)   Elevated troponin   Acute respiratory failure with hypoxia and hypercapnia (HCC)   1-Acute hypoxic and hypercarbic respiratory failure, nonischemic cardiomyopathy: Patient presented with shortness of breath hypoxia and he is stop his evening Lasix dose. Prior history of nonischemic cardiomyopathy with ejection fraction 20 to 25% that has improved to 55% per notes in 2016. -hold IV lasix, patient hypotensive this am.  -Follow echocardiogram. -cardiology consulted for evaluation of possible cardiogenic shock.   2-Hypotensive;  Asymptomatic.  Hold Cardizem, Coreg which he has not received.  Hold lasix.  Concern for  cardiogenic shock. Cardiology consulted.  IV bolus 250 cc times one.  Check lactic acid, CBC.  Pro-calcitonin less than 0,1/ less  likely infection, but will cover with antibiotics. Repeat chest x-ray.  UA with too numerous to count WBC>   3-AKI: Creatinine on admission at 2.3, most recent prior labs on 12/17/2014.  Patient report blood work 6 months ago with no mention of kidney problems. Hold ACE, follow renal ultrasound. Hold lasix.  Bladder scan.  Check [place]  4-A. fib with RVR: Subtherapeutic INR: Hold Cardizem due to hypotension.  Continue with warfarin with pharmacy to dose.  5-Elevated troponin: Chest pain-free, repeated troponin was trending down.  6-OSA: Continue with CPAP at at bedtime.  Bilateral bibasilar opacity on chest x-ray: outside facility.  Check chest ray. Initiate antibiotics due to hypotension.       Estimated body mass index is 66.14 kg/m as calculated from the following:   Height as of this encounter: 6' (1.829 m).   Weight as of this encounter: 221.2 kg.   DVT prophylaxis: Coumadin Code Status: Full code Family Communication; care discussed with patient Disposition Plan: remain in the hospital for management of hypotension, heart failure, concern for cardiogenic shock.    Consultants:      Procedures:   Echo   Antimicrobials:   Ceftriaxone  azithromycin   Subjective: He is alert, sitting in the bed, mild dizziness. Denies chest pain.    Objective: Vitals:   01/21/19 0418  BP: (!) 102/59  Pulse: 80  Resp: 18  Temp: 98.2 F (36.8 C)  TempSrc: Oral  SpO2: 100%  Weight: (!) 221.2 kg  Height: 6' (1.829 m)    Intake/Output Summary (Last 24 hours) at 01/21/2019 8144 Last data filed at 01/21/2019 0600 Gross per 24 hour  Intake 240 ml  Output 200 ml  Net 40 ml   Filed Weights   01/21/19 0418  Weight: (!) 221.2 kg    Examination:  General exam: Appears calm and comfortable  Respiratory system: normal respiratory effort, decreased breath sounds.  Cardiovascular system: distant heart sounds. S 1, S 2   Gastrointestinal system: Abdomen is nondistended,  soft and nontender. No organomegaly or masses felt. Normal bowel sounds heard. Central nervous system: Alert and oriented. No focal neurological deficits. Extremities: Symmetric 5 x 5 power. Plus 2 edema Skin: No rashes, lesions or ulcers   Data Reviewed: I have personally reviewed following labs and imaging studies  CBC: No results for input(s): WBC, NEUTROABS, HGB, HCT, MCV, PLT in the last 168 hours. Basic Metabolic Panel: Recent Labs  Lab 01/21/19 0510  NA 138  K 4.9  CL 93*  CO2 33*  GLUCOSE 101*  BUN 42*  CREATININE 2.35*  CALCIUM 9.1   GFR: Estimated Creatinine Clearance: 67.8 mL/min (A) (by C-G formula based on SCr of 2.35 mg/dL (H)). Liver Function Tests: No results for input(s): AST, ALT, ALKPHOS, BILITOT, PROT, ALBUMIN in the last 168 hours. No results for input(s): LIPASE, AMYLASE in the last 168 hours. No results for input(s): AMMONIA in the last 168 hours. Coagulation Profile: No results for input(s): INR, PROTIME in the last 168 hours. Cardiac Enzymes: No results for input(s): CKTOTAL, CKMB, CKMBINDEX, TROPONINI in the last 168 hours. BNP (last 3 results) No results for input(s): PROBNP in the last 8760 hours. HbA1C: No results for input(s): HGBA1C in the last 72 hours. CBG: No results for input(s): GLUCAP in the last 168 hours. Lipid Profile: No results for input(s): CHOL, HDL, LDLCALC, TRIG, CHOLHDL, LDLDIRECT in the last 72 hours. Thyroid Function Tests: No results for input(s): TSH, T4TOTAL, FREET4, T3FREE, THYROIDAB in the last 72 hours. Anemia Panel: No results for input(s): VITAMINB12, FOLATE, FERRITIN, TIBC, IRON, RETICCTPCT in the last 72 hours. Sepsis Labs: No results for input(s): PROCALCITON, LATICACIDVEN in the last 168 hours.  Recent Results (from the past 240 hour(s))  SARS Coronavirus 2 (CEPHEID - [facility] Hosp Order     Status: None   Collection Time: 01/21/19  4:58 AM  Result Value Ref Range Status    SARS Coronavirus 2 NEGATIVE NEGATIVE Final    Comment: (NOTE) If result is NEGATIVE SARS-CoV-2 target nucleic acids are NOT DETECTED. The SARS-CoV-2 RNA is generally detectable in upper and lower  respiratory specimens during the acute phase of infection. The lowest  concentration of SARS-CoV-2 viral copies this assay can detect is 250  copies / mL. A negative result does not preclude SARS-CoV-2 infection  and should not be used as the sole basis for treatment or other  patient management decisions.  A negative result may occur with  improper specimen collection / handling, submission of specimen other  than nasopharyngeal swab, presence of viral mutation(s) within the  areas targeted by this assay, and inadequate number of viral copies  (<250 copies / mL). A negative result must be combined with clinical  observations, patient history, and epidemiological information. If result is POSITIVE SARS-CoV-2 target nucleic acids are DETECTED. The SARS-CoV-2 RNA is generally detectable in upper and lower  respiratory specimens dur ing the acute phase of infection.  Positive  results are indicative of active infection with SARS-CoV-2.  Clinical  correlation with patient history and other diagnostic information is  necessary to determine patient infection status.  Positive results  do  not rule out bacterial infection or co-infection with other viruses. If result is PRESUMPTIVE POSTIVE SARS-CoV-2 nucleic acids MAY BE PRESENT.   A presumptive positive result was obtained on the submitted specimen  and confirmed on repeat testing.  While 2019 novel coronavirus  (SARS-CoV-2) nucleic acids may be present in the submitted sample  additional confirmatory testing may be necessary for epidemiological  and / or clinical management purposes  to differentiate between  SARS-CoV-2 and other Sarbecovirus currently known to infect humans.  If clinically indicated additional testing with an alternate test   methodology [phone]) is advised. The SARS-CoV-2 RNA is generally  detectable in upper and lower respiratory sp ecimens during the acute  phase of infection. The expected result is Negative. Fact Sheet for Patients:  BoilerBrush.com.cy Fact Sheet for Healthcare Providers: https://pope.com/ This test is not yet approved or cleared by the [place] FDA and has been authorized for detection and/or diagnosis of SARS-CoV-2 by FDA under an Emergency Use Authorization (EUA).  This EUA will remain in effect (meaning this test can be used) for the duration of the COVID-19 declaration under Section 564(b)(1) of the Act, 21 U.S.C. section 360bbb-3(b)(1), unless the authorization is terminated or revoked sooner. Performed at [facility], [address] [place], [place] [address]          Radiology Studies: No results found.      Scheduled Meds: . aspirin EC  81 mg Oral Daily  . carvedilol  12.5 mg Oral BID WC  . diltiazem  240 mg Oral Daily  . furosemide  80 mg Intravenous Q12H  . sodium chloride flush  3 mL Intravenous Q12H  . warfarin  7.5 mg Oral ONCE-1800  . Warfarin - Pharmacist Dosing Inpatient   Does not apply q1800   Continuous Infusions: . sodium chloride       LOS: 0 days    Time spent: 35 minutes.     Alba Cory, MD [facility] Pager [phone]  If 7PM-7AM, please contact night-coverage [email or web address] Password [email or web address] 01/21/2019, 7:12 AM

## 2019-01-21 NOTE — H&P (Signed)
History and Physical    Travis Bautista SUP:[id number] DOB: 1961-02-08 DOA: 01/21/2019  PCP: Chilton Greathouse, MD   Patient coming from: Home   Chief Complaint: SOB, hypoxia   HPI: Travis Bautista is a 58 y.o. male with medical history significant for obesity (BMI >60), nonischemic cardiomyopathy, atrial fibrillation on Coumadin, now presenting to the emergency department for evaluation of shortness of breath.  Patient reports that he been in his usual state of health until several days ago when his wife became concerned because the patient's fingertips appeared blue.  She also began to notice a bluish hue about his lips.  Patient was not feeling dyspneic at that time, but has now developed dyspnea with minimal exertion over the past day or so.  He bought a pulse oximeter due to the acrocyanosis and reports that he was saturating in the 70s yesterday.  He is feeling short of breath today and saturations were in the 80s.  He has not had any fevers or chills, reports only occasional coughing that he attributes to postnasal drainage, and denies any sick contacts.  He stopped taking his evening Lasix because it was keeping him from sleep. Has not been compliant with INR monitoring. He feels that his chronic leg swelling is unchanged and he denies any leg tenderness.  No chest pain or palpitations.  Denies hemoptysis.  He has not noted any orthopnea at home, but reports that his breathing seem to be worse while lying flat in the ambulance on the way to the hospital.  [name] ED Course: Upon arrival to the ED, patient is found to be afebrile, saturating in the low to mid 80s on room air while at rest, slightly tachycardic, and with stable blood pressure.  EKG features atrial fibrillation with right axis deviation and chest x-ray is notable for cardiomegaly with mild bibasilar opacities.  Chemistry panel is notable for a creatinine of 2.31, previously normal.  CBC features a slight thrombocytopenia and  macrocytosis.  INR subtherapeutic at 1.7.  Troponin is elevated to 0.067, slightly decreased on repeat. Pro-BNP 3351. is ABG features pH of 7.32 with pCO2 68 and pO2 79.  Patient was treated with Lasix, Rocephin, and azithromycin in the ED.  Transfer to [facility] was arranged for ongoing evaluation and management of acute hypoxic and hypercarbic respiratory failure and renal insufficiency of uncertain chronicity.  Review of Systems:  All other systems reviewed and apart from HPI, are negative.  Past Medical History:  Diagnosis Date  . Atrial fibrillation, permanent    Failed DCCV 04/2008  . Chronic anticoagulation    on coumadin  . Dysrhythmia    A FIB - FOLLOWED BY DR. HILTY  . History of gout   . Hypertension   . Morbid obesity with BMI of 60.0-69.9, adult (HCC)   . Nonischemic cardiomyopathy (HCC)     Last Echo 01/28/2008, EF 20%, last cardiac cath 2009 ( Lt & Rt) with normal cors  . OSA (obstructive sleep apnea)    HAS NOT BEEN SET UP WITH C-PAP YET     Past Surgical History:  Procedure Laterality Date  . CARDIAC CATHETERIZATION  01/31/2008   patent cors, CO/CI 4.8/1.6, PA 65/33 mean 43; PCW 35/41, mean 35; Ra 31/35; RV mean31  . CARDIOVERSION  may 2009  . LAPAROSCOPIC GASTRIC SLEEVE RESECTION N/A 07/30/2015   Procedure: LAPAROSCOPIC GASTRIC SLEEVE RESECTION WITH UPPER ENDOSCOPY;  Surgeon: De Blanch Kinsinger, MD;  Location: [facility] ORS;  Service: General;  Laterality: N/A;  reports that he has never smoked. He quit smokeless tobacco use about 21 years ago.  His smokeless tobacco use included chew. He reports that he does not drink alcohol or use drugs.  No Known Allergies  History reviewed. No pertinent family history.   Prior to Admission medications   Medication Sig Start Date End Date Taking? Authorizing Provider  allopurinol (ZYLOPRIM) 300 MG tablet Take 1 tablet (300 mg total) by mouth daily. 10/27/14   Rai, Delene Ruffini, MD  aspirin EC 81 MG tablet Take 81 mg by  mouth daily.    [provider]  carvedilol (COREG) 12.5 MG tablet Take 1 tablet (12.5 mg total) by mouth 2 (two) times daily with a meal. 10/12/18   Hilty, Lisette Abu, MD  diltiazem (CARDIZEM CD) 240 MG 24 hr capsule Take 1 capsule (240 mg total) by mouth daily. 10/12/18   Hilty, Lisette Abu, MD  furosemide (LASIX) 40 MG tablet TAKE 2 TABLETS (80MG ) BY MOUTH IN THE AM AND 1 TABLET (40MG ) IN THE PM. 09/21/18   Hilty, Lisette Abu, MD  Multiple Vitamin (MULTIVITAMIN) tablet Take 1 tablet by mouth daily.    [provider]  ramipril (ALTACE) 5 MG capsule Take 1 capsule (5 mg total) by mouth daily. Please keep your upcoming appointment for refills. 12/06/18   Hilty, Lisette Abu, MD  spironolactone (ALDACTONE) 25 MG tablet Take 1 tablet (25 mg total) by mouth daily. 11/03/18   Chrystie Nose, MD  traZODone (DESYREL) 50 MG tablet  09/16/18   [provider]  triamcinolone cream (KENALOG) 0.1 %  09/17/18   [provider]  warfarin (COUMADIN) 5 MG tablet TAKE 1 TO 1 & 1/2 TABLETS BY MOUTH DAILY AS DIRECTED. NEED INR 06/17/18   Chrystie Nose, MD    Physical Exam: Vitals:   01/21/19 0418  BP: (!) 102/59  Pulse: 80  Resp: 18  Temp: 98.2 F (36.8 C)  TempSrc: Oral  SpO2: 100%    Constitutional: NAD, calm  Eyes: PERTLA, lids and conjunctivae normal ENMT: Mucous membranes are moist. Posterior pharynx clear of any exudate or lesions.   Neck: normal, supple, no masses, no thyromegaly Respiratory: no wheezing, rhonchi. No accessory muscle use.  Cardiovascular: Rate ~80 and irregularly irregular. Pitting pretibial edema bilaterally.  Abdomen: No distension, no tenderness, soft. Bowel sounds active.  Musculoskeletal: no clubbing / cyanosis. No joint deformity upper and lower extremities.   Skin: no significant rashes, lesions, ulcers. Warm, dry, well-perfused. Neurologic: CN 2-12 grossly intact. Sensation intact. Strength 5/5 in all 4 limbs.  Psychiatric: Alert and oriented  x 3. Pleasant, cooperative.    Labs on Admission: I have personally reviewed following labs and imaging studies  CBC: No results for input(s): WBC, NEUTROABS, HGB, HCT, MCV, PLT in the last 168 hours. Basic Metabolic Panel: No results for input(s): NA, K, CL, CO2, GLUCOSE, BUN, CREATININE, CALCIUM, MG, PHOS in the last 168 hours. GFR: CrCl cannot be calculated (Patient's most recent lab result is older than the maximum 21 days allowed.). Liver Function Tests: No results for input(s): AST, ALT, ALKPHOS, BILITOT, PROT, ALBUMIN in the last 168 hours. No results for input(s): LIPASE, AMYLASE in the last 168 hours. No results for input(s): AMMONIA in the last 168 hours. Coagulation Profile: No results for input(s): INR, PROTIME in the last 168 hours. Cardiac Enzymes: No results for input(s): CKTOTAL, CKMB, CKMBINDEX, TROPONINI in the last 168 hours. BNP (last 3 results) No results for input(s): PROBNP in the last 8760  hours. HbA1C: No results for input(s): HGBA1C in the last 72 hours. CBG: No results for input(s): GLUCAP in the last 168 hours. Lipid Profile: No results for input(s): CHOL, HDL, LDLCALC, TRIG, CHOLHDL, LDLDIRECT in the last 72 hours. Thyroid Function Tests: No results for input(s): TSH, T4TOTAL, FREET4, T3FREE, THYROIDAB in the last 72 hours. Anemia Panel: No results for input(s): VITAMINB12, FOLATE, FERRITIN, TIBC, IRON, RETICCTPCT in the last 72 hours. Urine analysis:    Component Value Date/Time   COLORURINE YELLOW 01/27/2008 2233   APPEARANCEUR CLEAR 01/27/2008 2233   LABSPEC 1.016 01/27/2008 2233   PHURINE 6.0 01/27/2008 2233   GLUCOSEU NEGATIVE 01/27/2008 2233   HGBUR NEGATIVE 01/27/2008 2233   BILIRUBINUR NEGATIVE 01/27/2008 2233   KETONESUR NEGATIVE 01/27/2008 2233   PROTEINUR NEGATIVE 01/27/2008 2233   UROBILINOGEN 1.0 01/27/2008 2233   NITRITE NEGATIVE 01/27/2008 2233   LEUKOCYTESUR  01/27/2008 2233    NEGATIVE MICROSCOPIC NOT DONE ON URINES WITH  NEGATIVE PROTEIN, BLOOD, LEUKOCYTES, NITRITE, OR GLUCOSE <1000 mg/dL.   Sepsis Labs: (procalcitonin:4,lacticidven:4) )No results found for this or any previous visit (from the past 240 hour(s)).   Radiological Exams on Admission: No results found.  EKG: Independently reviewed. Atrial fibrillation, RAD.   Assessment/Plan  1. Acute hypoxic and hypercarbic respiratory failure; non-ischemic cardiomyopathy   - Presents with SOB and hypoxia after he stopped his evening Lasix dose, is found to have new 3 Lpm supplemental O2 requirement while at rest, pCO2 68, pro-BNP 3351, and CXR with cardiomegaly and mild bibasilar opacity  - He has hx of NICM with EF 20-25% that improved to 55% in 2016 per notes  - He was treated with IV Lasix, Rocephin, and azithromycin in ED  - Continue diuresis with Lasix 80 mg IV q12h, continue Coreg as tolerated, check procalcitonin and consider continuing antibiotics, follow daily wt and I/O, and update echocardiogram    2. Renal insufficiency   - SCr is 2.31 in ED; most recent prior lab on file is from 2016; pt reports blood work ~6 months ago with no mention of kidney problem  - Check renal US, UA, and urine chemistries; renally-dose medications, hold ACE   3. Atrial fibrillation; subtherapeutic INR  - In rate-controlled a fib on admission  - CHADS-VASc may be only 2 (HTN, CHF)  - Continue warfarin with pharmacy assistance, continue diltiazem as tolerated    4. Elevated troponin - There was a mild troponin elevation in ED - Patient denies any chest pain and repeat troponin was downtrending     5. OSA  - Continue CPAP qHS     PPE: Mask, face shield. Patient wearing mask.  DVT prophylaxis: warfarin  Code Status: Full  Family Communication: Discussed with patient  Consults called: None   Admission status: Inpatient. Patient has new hypoxia requiring 3 Lpm while at rest, and suspected acute CHF complicated new renal failure. I do not think that  these issues can be managed safely within timeframe of observation and he will be admitted to avoid further complications and poor outcome.    Briscoe Deutscher, MD [facility] Pager [phone]  If 7PM-7AM, please contact night-coverage [email or web address] Password [facility]  01/21/2019, 4:34 AM

## 2019-01-21 NOTE — Progress Notes (Signed)
MD notified of pt current VS and bleeding from IV site. Pt is asymptomatic. Held coumadin and INR ordered stat. Redressed IV site. Site flushes well and no complications observed or reported to/about site.

## 2019-01-21 NOTE — Progress Notes (Signed)
NT reported low BP after taken on each arm. Did verify with  Manual BP. Pt c/o SOB. Denies any other symptoms. Placed on 2L of o2 and assessment documented. MD in room rounding and new orders given and initiated. Pt placed in T-berg position. Call bell in hand and this writer at bedside monitoring.

## 2019-01-21 NOTE — Progress Notes (Signed)
  Echocardiogram 2D Echocardiogram has been performed.  Travis Bautista Travis Bautista 01/21/2019, 10:26 AM

## 2019-01-21 NOTE — Progress Notes (Addendum)
Paged by bedside RN regarding a change in pts mental status. Pt had been A&O x 4 with no neurological deficits. Bedside RN went in to wake pt up and he was mumbling incoherently and not following commands. According to bedside RN he began to improve after a few minutes and became A&O x 4. When I saw the pt his NIH was 0 and there were no neurological deficits noted. VSS. Stat Head CT scan ordered.  Stevie Kern AGPCNP-BC, AGNP-C [facility] Pager [phone]  Addendum: Head CT resulted with a "Negative Exam"

## 2019-01-21 NOTE — Plan of Care (Signed)
  Problem: Education: Goal: Knowledge of General Education information will improve Description: Including pain rating scale, medication(s)/side effects and non-pharmacologic comfort measures Outcome: Progressing   Problem: Education: Goal: Knowledge of General Education information will improve Description: Including pain rating scale, medication(s)/side effects and non-pharmacologic comfort measures Outcome: Progressing   Problem: Clinical Measurements: Goal: Ability to maintain clinical measurements within normal limits will improve Outcome: Progressing   

## 2019-01-21 NOTE — Discharge Instructions (Addendum)
Heart Failure  Heart failure means your heart has trouble pumping blood. This makes it hard for your body to work well. Heart failure is usually a long-term (chronic) condition. You must take good care of yourself and follow your treatment plan from your doctor. Follow these instructions at home: Medicines  Take over-the-counter and prescription medicines only as told by your doctor. ? Do not stop taking your medicine unless your doctor told you to do that. ? Do not skip any doses. ? Refill your prescriptions before you run out of medicine. You need your medicines every day. Eating and drinking   Eat heart-healthy foods. Talk with a diet and nutrition specialist (dietitian) to make an eating plan.  Choose foods that: ? Have no trans fat. ? Are low in saturated fat and cholesterol.  Choose healthy foods, like: ? Fresh or frozen fruits and vegetables. ? Fish. ? Low-fat (lean) meats. ? Legumes (like beans, peas, and lentils). ? Fat-free or low-fat dairy products. ? Whole-grain foods. ? High-fiber foods.  Limit salt (sodium) if told by your doctor. Ask your nutrition specialist to recommend heart-healthy seasonings.  Cook in healthy ways instead of frying. Healthy ways of cooking include: ? Roasting. ? Grilling. ? Broiling. ? Baking. ? Poaching. ? Steaming. ? Stir-frying.  Limit how much fluid you drink, if told by your doctor. Lifestyle  Do not smoke or use chewing tobacco. Do not use nicotine gum or patches before talking to your doctor.  Limit alcohol intake to no more than 1 drink a day for non-pregnant women and 2 drinks a day for men. One drink equals 12 oz of beer, 5 oz of wine, or 1 oz of hard liquor. ? Tell your doctor if you drink alcohol many times a week. ? Talk with your doctor about whether any alcohol is safe for you. ? You should stop drinking alcohol: ? If your heart has been damaged by alcohol. ? You have very bad heart failure.  Do not use illegal  drugs.  Lose weight if told by your doctor.  Do moderate physical activity if told by your doctor. Ask your doctor what activities are safe for you if: ? You are of older age (elderly). ? You have very bad heart failure. Keep track of important information  Weigh yourself every day. ? Weigh yourself every morning after you pee (urinate) and before breakfast. ? Wear the same amount of clothing each time. ? Write down your daily weight. Give your record to your doctor.  Check and write down your blood pressure as told by your doctor.  Check your pulse as told by your doctor. Dealing with heat and cold  If the weather is very hot: ? Avoid activity that takes a lot of energy. ? Use air conditioning or fans, or find a cooler place. ? Avoid caffeine. ? Avoid alcohol. ? Wear clothing that is loose-fitting, lightweight, and light-colored.  If the weather is very cold: ? Avoid activity that takes a lot of energy. ? Layer your clothes. ? Wear mittens or gloves, a hat, and a scarf when you go outside. ? Avoid alcohol. General instructions  Manage other conditions that you have as told by your doctor.  Learn to manage stress. If you need help, ask your doctor.  Plan rest periods for when you get tired.  Get education and support as needed.  Get rehab (rehabilitation) to help you stay independent and to help with everyday tasks.  Stay up to date with shots (  immunizations), especially pneumococcal and flu (influenza) shots.  Keep all follow-up visits as told by your doctor. This is important. Contact a doctor if:  You gain weight quickly.  You are more short of breath than normal.  You cannot do your normal activities.  You tire easily.  You cough more than normal, especially with activity.  You have any or more puffiness (swelling) in areas such as your hands, feet, ankles, or belly (abdomen).  You cannot sleep because it is hard to breathe.  You feel like your heart  is beating fast (palpitations).  You get dizzy or light-headed when you stand up. Get help right away if:  You have trouble breathing.  You or someone else notices a change in your awareness. This could be trouble staying awake or trouble concentrating.  You have chest pain or discomfort.  You pass out (faint). Summary  Heart failure means your heart has trouble pumping blood.  Make sure you refill your prescriptions before you run out of medicine. You need your medicines every day.  Keep records of your weight and blood pressure to give to your doctor.  Contact a doctor if you gain weight quickly. This information is not intended to replace advice given to you by your health care provider. Make sure you discuss any questions you have with your health care provider. Document Released: 05/13/2008 Document Revised: 04/28/2018 Document Reviewed: 08/26/2016 Elsevier Interactive Patient Education  2019 [employer]    Atrial Fibrillation  Atrial fibrillation is a type of heartbeat that is irregular or fast (rapid). If you have this condition, your heart beats without any order. This makes it hard for your heart to pump blood in a normal way. Having this condition gives you more risk for stroke, heart failure, and other heart problems. Atrial fibrillation may start all of a sudden and then stop on its own, or it may become a long-lasting problem. What are the causes? This condition may be caused by heart conditions, such as:  High blood pressure.  Heart failure.  Heart valve disease.  Heart surgery. Other causes include:  Pneumonia.  Obstructive sleep apnea.  Lung cancer.  Thyroid disease.  Drinking too much alcohol. Sometimes the cause is not known. What increases the risk? You are more likely to develop this condition if:  You smoke.  You are older.  You have diabetes.  You are overweight.  You have a family history of this condition.  You exercise  often and hard. What are the signs or symptoms? Common symptoms of this condition include:  A feeling like your heart is beating very fast.  Chest pain.  Feeling short of breath.  Feeling light-headed or weak.  Getting tired easily. Follow these instructions at home: Medicines  Take over-the-counter and prescription medicines only as told by your doctor.  If your doctor gives you a blood-thinning medicine, take it exactly as told. Taking too much of it can cause bleeding. Taking too little of it does not protect you against clots. Clots can cause a stroke. Lifestyle      Do not use any tobacco products. These include cigarettes, chewing tobacco, and e-cigarettes. If you need help quitting, ask your doctor.  Do not drink alcohol.  Do not drink beverages that have caffeine. These include coffee, soda, and tea.  Follow diet instructions as told by your doctor.  Exercise regularly as told by your doctor. General instructions  If you have a condition that causes breathing to stop for a  short period of time (apnea), treat it as told by your doctor.  Keep a healthy weight. Do not use diet pills unless your doctor says they are safe for you. Diet pills may make heart problems worse.  Keep all follow-up visits as told by your doctor. This is important. Contact a doctor if:  You notice a change in the speed, rhythm, or strength of your heartbeat.  You are taking a blood-thinning medicine and you see more bruising.  You get tired more easily when you move or exercise.  You have a sudden change in weight. Get help right away if:   You have pain in your chest or your belly (abdomen).  You have trouble breathing.  You have blood in your vomit, poop, or pee (urine).  You have any signs of a stroke. "BE FAST" is an easy way to remember the main warning signs: ? B - Balance. Signs are dizziness, sudden trouble walking, or loss of balance. ? E - Eyes. Signs are trouble  seeing or a change in how you see. ? F - Face. Signs are sudden weakness or loss of feeling in the face, or the face or eyelid drooping on one side. ? A - Arms. Signs are weakness or loss of feeling in an arm. This happens suddenly and usually on one side of the body. ? S - Speech. Signs are sudden trouble speaking, slurred speech, or trouble understanding what people say. ? T - Time. Time to call emergency services. Write down what time symptoms started.  You have other signs of a stroke, such as: ? A sudden, very bad headache with no known cause. ? Feeling sick to your stomach (nausea). ? Throwing up (vomiting). ? Jerky movements you cannot control (seizure). These symptoms may be an emergency. Do not wait to see if the symptoms will go away. Get medical help right away. Call your local emergency services (911 in the [place] Do not drive yourself to the hospital. Summary  Atrial fibrillation is a type of heartbeat that is irregular or fast (rapid).  You are at higher risk of this condition if you smoke, are older, have diabetes, or are overweight.  Follow your doctor's instructions about medicines, diet, exercise, and follow-up visits.  Get help right away if you think that you have signs of a stroke. This information is not intended to replace advice given to you by your health care provider. Make sure you discuss any questions you have with your health care provider. Document Released: 05/13/2008 Document Revised: 09/25/2017 Document Reviewed: 09/25/2017 Elsevier Interactive Patient Education  2019 [employer] Inc[employer]    Heart-Healthy Eating Plan Heart-healthy meal planning includes:  Eating less unhealthy fats.  Eating more healthy fats.  Making other changes in your diet. Talk with your doctor or a diet specialist (dietitian) to create an eating plan that is right for you. What is my plan? Your doctor may recommend an eating plan that includes:  Total fat: ______% or less of  total calories a day.  Saturated fat: ______% or less of total calories a day.  Cholesterol: less than _________mg a day. What are tips for following this plan? Cooking Avoid frying your food. Try to bake, boil, grill, or broil it instead. You can also reduce fat by:  Removing the skin from poultry.  Removing all visible fats from meats.  Steaming vegetables in water or broth. Meal planning   At meals, divide your plate into four equal parts: ? Fill one-half of  your plate with vegetables and green salads. ? Fill one-fourth of your plate with whole grains. ? Fill one-fourth of your plate with lean protein foods.  Eat 4-5 servings of vegetables per day. A serving of vegetables is: ? 1 cup of raw or cooked vegetables. ? 2 cups of raw leafy greens.  Eat 4-5 servings of fruit per day. A serving of fruit is: ? 1 medium whole fruit. ?  cup of dried fruit. ?  cup of fresh, frozen, or canned fruit. ?  cup of 100% fruit juice.  Eat more foods that have soluble fiber. These are apples, broccoli, carrots, beans, peas, and barley. Try to get 20-30 g of fiber per day.  Eat 4-5 servings of nuts, legumes, and seeds per week: ? 1 serving of dried beans or legumes equals  cup after being cooked. ? 1 serving of nuts is  cup. ? 1 serving of seeds equals 1 tablespoon. General information  Eat more home-cooked food. Eat less restaurant, buffet, and fast food.  Limit or avoid alcohol.  Limit foods that are high in starch and sugar.  Avoid fried foods.  Lose weight if you are overweight.  Keep track of how much salt (sodium) you eat. This is important if you have high blood pressure. Ask your doctor to tell you more about this.  Try to add vegetarian meals each week. Fats  Choose healthy fats. These include olive oil and canola oil, flaxseeds, walnuts, almonds, and seeds.  Eat more omega-3 fats. These include salmon, mackerel, sardines, tuna, flaxseed oil, and ground flaxseeds.  Try to eat fish at least 2 times each week.  Check food labels. Avoid foods with trans fats or high amounts of saturated fat.  Limit saturated fats. ? These are often found in animal products, such as meats, butter, and cream. ? These are also found in plant foods, such as palm oil, palm kernel oil, and coconut oil.  Avoid foods with partially hydrogenated oils in them. These have trans fats. Examples are stick margarine, some tub margarines, cookies, crackers, and other baked goods. What foods can I eat? Fruits All fresh, canned (in natural juice), or frozen fruits. Vegetables Fresh or frozen vegetables (raw, steamed, roasted, or grilled). Green salads. Grains Most grains. Choose whole wheat and whole grains most of the time. Rice and pasta, including brown rice and pastas made with whole wheat. Meats and other proteins Lean, well-trimmed beef, veal, pork, and lamb. Chicken and [place] without skin. All fish and shellfish. Wild duck, rabbit, pheasant, and venison. Egg whites or low-cholesterol egg substitutes. Dried beans, peas, lentils, and tofu. Seeds and most nuts. Dairy Low-fat or nonfat cheeses, including ricotta and mozzarella. Skim or 1% milk that is liquid, powdered, or evaporated. Buttermilk that is made with low-fat milk. Nonfat or low-fat yogurt. Fats and oils Non-hydrogenated (trans-free) margarines. Vegetable oils, including soybean, sesame, sunflower, olive, peanut, safflower, corn, canola, and cottonseed. Salad dressings or mayonnaise made with a vegetable oil. Beverages Mineral water. Coffee and tea. Diet carbonated beverages. Sweets and desserts Sherbet, gelatin, and fruit ice. Small amounts of dark chocolate. Limit all sweets and desserts. Seasonings and condiments All seasonings and condiments. The items listed above may not be a complete list of foods and drinks you can eat. Contact a dietitian for more options. What foods should I avoid? Fruits Canned fruit in  heavy syrup. Fruit in cream or butter sauce. Fried fruit. Limit coconut. Vegetables Vegetables cooked in cheese, cream, or butter sauce. Fried vegetables.  Grains Breads that are made with saturated or trans fats, oils, or whole milk. Croissants. Sweet rolls. Donuts. High-fat crackers, such as cheese crackers. Meats and other proteins Fatty meats, such as hot dogs, ribs, sausage, bacon, rib-eye roast or steak. High-fat deli meats, such as salami and bologna. Caviar. Domestic duck and goose. Organ meats, such as liver. Dairy Cream, sour cream, cream cheese, and creamed cottage cheese. Whole-milk cheeses. Whole or 2% milk that is liquid, evaporated, or condensed. Whole buttermilk. Cream sauce or high-fat cheese sauce. Yogurt that is made from whole milk. Fats and oils Meat fat, or shortening. Cocoa butter, hydrogenated oils, palm oil, coconut oil, palm kernel oil. Solid fats and shortenings, including bacon fat, salt pork, lard, and butter. Nondairy cream substitutes. Salad dressings with cheese or sour cream. Beverages Regular sodas and juice drinks with added sugar. Sweets and desserts Frosting. Pudding. Cookies. Cakes. Pies. Milk chocolate or white chocolate. Buttered syrups. Full-fat ice cream or ice cream drinks. The items listed above may not be a complete list of foods and drinks to avoid. Contact a dietitian for more information. Summary  Heart-healthy meal planning includes eating less unhealthy fats, eating more healthy fats, and making other changes in your diet.  Eat a balanced diet. This includes fruits and vegetables, low-fat or nonfat dairy, lean protein, nuts and legumes, whole grains, and heart-healthy oils and fats. This information is not intended to replace advice given to you by your health care provider. Make sure you discuss any questions you have with your health care provider. Document Released: 02/03/2012 Document Revised: 09/11/2017 Document Reviewed:  09/11/2017 Elsevier Interactive Patient Education  2019 [employer].    Information on my medicine - Coumadin   (Warfarin)  Why was Coumadin prescribed for you? Coumadin was prescribed for you because you have a blood clot or a medical condition that can cause an increased risk of forming blood clots. Blood clots can cause serious health problems by blocking the flow of blood to the heart, lung, or brain. Coumadin can prevent harmful blood clots from forming. As a reminder your indication for Coumadin is:   Stroke Prevention Because Of Atrial Fibrillation  What test will check on my response to Coumadin? While on Coumadin (warfarin) you will need to have an INR test regularly to ensure that your dose is keeping you in the desired range. The INR (international normalized ratio) number is calculated from the result of the laboratory test called prothrombin time (PT).  If an INR APPOINTMENT HAS NOT ALREADY BEEN MADE FOR YOU please schedule an appointment to have this lab work done by your health care provider within 7 days. Your INR goal is usually a number between:  2 to 3 or your provider may give you a more narrow range like 2-2.5.  Ask your health care provider during an office visit what your goal INR is.  What  do you need to  know  About  COUMADIN? Take Coumadin (warfarin) exactly as prescribed by your healthcare provider about the same time each day.  DO NOT stop taking without talking to the doctor who prescribed the medication.  Stopping without other blood clot prevention medication to take the place of Coumadin may increase your risk of developing a new clot or stroke.  Get refills before you run out.  What do you do if you miss a dose? If you miss a dose, take it as soon as you remember on the same day then continue your regularly scheduled regimen  the next day.  Do not take two doses of Coumadin at the same time.  Important Safety Information A possible side effect of Coumadin  (Warfarin) is an increased risk of bleeding. You should call your healthcare provider right away if you experience any of the following: ? Bleeding from an injury or your nose that does not stop. ? Unusual colored urine (red or dark brown) or unusual colored stools (red or black). ? Unusual bruising for unknown reasons. ? A serious fall or if you hit your head (even if there is no bleeding).  Some foods or medicines interact with Coumadin (warfarin) and might alter your response to warfarin. To help avoid this: ? Eat a balanced diet, maintaining a consistent amount of Vitamin K. ? Notify your provider about major diet changes you plan to make. ? Avoid alcohol or limit your intake to 1 drink for women and 2 drinks for men per day. (1 drink is 5 oz. wine, 12 oz. beer, or 1.5 oz. liquor.)  Make sure that ANY health care provider who prescribes medication for you knows that you are taking Coumadin (warfarin).  Also make sure the healthcare provider who is monitoring your Coumadin knows when you have started a new medication including herbals and non-prescription products.  Coumadin (Warfarin)  Major Drug Interactions  Increased Warfarin Effect Decreased Warfarin Effect  Alcohol (large quantities) Antibiotics (esp. Septra/Bactrim, Flagyl, Cipro) Amiodarone (Cordarone) Aspirin (ASA) Cimetidine (Tagamet) Megestrol (Megace) NSAIDs (ibuprofen, naproxen, etc.) Piroxicam (Feldene) Propafenone (Rythmol SR) Propranolol (Inderal) Isoniazid (INH) Posaconazole (Noxafil) Barbiturates (Phenobarbital) Carbamazepine (Tegretol) Chlordiazepoxide (Librium) Cholestyramine (Questran) Griseofulvin Oral Contraceptives Rifampin Sucralfate (Carafate) Vitamin K   Coumadin (Warfarin) Major Herbal Interactions  Increased Warfarin Effect Decreased Warfarin Effect  Garlic Ginseng Ginkgo biloba Coenzyme Q10 Green tea St. Johns wort    Coumadin (Warfarin) FOOD Interactions  Eat a consistent number of  servings per week of foods HIGH in Vitamin K (1 serving =  cup)  Collards (cooked, or boiled & drained) Kale (cooked, or boiled & drained) Mustard greens (cooked, or boiled & drained) Parsley *serving size only =  cup Spinach (cooked, or boiled & drained) Swiss chard (cooked, or boiled & drained) Turnip greens (cooked, or boiled & drained)  Eat a consistent number of servings per week of foods MEDIUM-HIGH in Vitamin K (1 serving = 1 cup)  Asparagus (cooked, or boiled & drained) Broccoli (cooked, boiled & drained, or raw & chopped) Brussel sprouts (cooked, or boiled & drained) *serving size only =  cup Lettuce, raw (green leaf, endive, romaine) Spinach, raw Turnip greens, raw & chopped   These websites have more information on Coumadin (warfarin):  http://www.king-russell.com/; https://www.hines.net/;

## 2019-01-21 NOTE — Progress Notes (Signed)
ANTICOAGULATION CONSULT NOTE - Initial Consult  Pharmacy Consult for Coumadin Indication: atrial fibrillation  No Known Allergies  Patient Measurements: Height: 6' (182.9 cm) Weight: (!) 487 lb 11.2 oz (221.2 kg) IBW/kg (Calculated) : 77.6  Vital Signs: Temp: 98.2 F (36.8 C) (06/05 0418) Temp Source: Oral (06/05 0418) BP: 102/59 (06/05 0418) Pulse Rate: 80 (06/05 0418)  Labs: No results for input(s): HGB, HCT, PLT, APTT, LABPROT, INR, HEPARINUNFRC, HEPRLOWMOCWT, CREATININE, CKTOTAL, CKMB, TROPONINI in the last 72 hours.  CrCl cannot be calculated (Patient's most recent lab result is older than the maximum 21 days allowed.).   Medical History: Past Medical History:  Diagnosis Date  . Atrial fibrillation, permanent    Failed DCCV 04/2008  . Chronic anticoagulation    on coumadin  . Dysrhythmia    A FIB - FOLLOWED BY DR. HILTY  . History of gout   . Hypertension   . Morbid obesity with BMI of 60.0-69.9, adult (HCC)   . Nonischemic cardiomyopathy (HCC)     Last Echo 01/28/2008, EF 20%, last cardiac cath 2009 ( Lt & Rt) with normal cors  . OSA (obstructive sleep apnea)    HAS NOT BEEN SET UP WITH C-PAP YET     Medications:  No current facility-administered medications on file prior to encounter.    Current Outpatient Medications on File Prior to Encounter  Medication Sig Dispense Refill  . allopurinol (ZYLOPRIM) 300 MG tablet Take 1 tablet (300 mg total) by mouth daily. 30 tablet 4  . aspirin EC 81 MG tablet Take 81 mg by mouth daily.    . carvedilol (COREG) 12.5 MG tablet Take 1 tablet (12.5 mg total) by mouth 2 (two) times daily with a meal. 180 tablet 3  . diltiazem (CARDIZEM CD) 240 MG 24 hr capsule Take 1 capsule (240 mg total) by mouth daily. 90 capsule 3  . furosemide (LASIX) 40 MG tablet TAKE 2 TABLETS (80MG ) BY MOUTH IN THE AM AND 1 TABLET (40MG ) IN THE PM. 90 tablet 11  . Multiple Vitamin (MULTIVITAMIN) tablet Take 1 tablet by mouth daily.    . ramipril  (ALTACE) 5 MG capsule Take 1 capsule (5 mg total) by mouth daily. Please keep your upcoming appointment for refills. 90 capsule 3  . spironolactone (ALDACTONE) 25 MG tablet Take 1 tablet (25 mg total) by mouth daily. 90 tablet 3  . traZODone (DESYREL) 50 MG tablet     . triamcinolone cream (KENALOG) 0.1 %     . warfarin (COUMADIN) 5 MG tablet TAKE 1 TO 1 & 1/2 TABLETS BY MOUTH DAILY AS DIRECTED. NEED INR 30 tablet 0     Assessment: 58 y.o. male with admitted with CHFand AKI, h/o Afib, to continue Coumadin.  INR at [facility] ED 1.7 tonight   Goal of Therapy:  INR 2-3 Monitor platelets by anticoagulation protocol: Yes   Plan:  Coumadin 7.5 mg po this evening F/U current home regimen  Travis Bautista, Travis Bautista 01/21/2019,4:47 AM

## 2019-01-22 DIAGNOSIS — R7989 Other specified abnormal findings of blood chemistry: Secondary | ICD-10-CM

## 2019-01-22 DIAGNOSIS — I951 Orthostatic hypotension: Secondary | ICD-10-CM

## 2019-01-22 DIAGNOSIS — I5023 Acute on chronic systolic (congestive) heart failure: Secondary | ICD-10-CM

## 2019-01-22 DIAGNOSIS — G4733 Obstructive sleep apnea (adult) (pediatric): Secondary | ICD-10-CM

## 2019-01-22 DIAGNOSIS — R791 Abnormal coagulation profile: Secondary | ICD-10-CM

## 2019-01-22 DIAGNOSIS — I4821 Permanent atrial fibrillation: Secondary | ICD-10-CM

## 2019-01-22 DIAGNOSIS — I10 Essential (primary) hypertension: Secondary | ICD-10-CM

## 2019-01-22 DIAGNOSIS — I959 Hypotension, unspecified: Secondary | ICD-10-CM

## 2019-01-22 LAB — BLOOD GAS, ARTERIAL
Acid-Base Excess: 6.7 mmol/L — ABNORMAL HIGH (ref 0.0–2.0)
Acid-Base Excess: 8.7 mmol/L — ABNORMAL HIGH (ref 0.0–2.0)
Bicarbonate: 33.8 mmol/L — ABNORMAL HIGH (ref 20.0–28.0)
Bicarbonate: 35.3 mmol/L — ABNORMAL HIGH (ref 20.0–28.0)
Delivery systems: POSITIVE
Drawn by: 535471
Drawn by: 237031
Expiratory PAP: 6
FIO2: 40
Inspiratory PAP: 16
O2 Content: 2 L/min
O2 Saturation: 92.5 %
O2 Saturation: 96.8 %
Patient temperature: 98.9
Patient temperature: 98.6
pCO2 arterial: 82.2 mmHg (ref 32.0–48.0)
pCO2 arterial: 77 mmHg (ref 32.0–48.0)
pH, Arterial: 7.239 — ABNORMAL LOW (ref 7.350–7.450)
pH, Arterial: 7.283 — ABNORMAL LOW (ref 7.350–7.450)
pO2, Arterial: 71.8 mmHg — ABNORMAL LOW (ref 83.0–108.0)
pO2, Arterial: 92.1 mmHg (ref 83.0–108.0)

## 2019-01-22 LAB — BASIC METABOLIC PANEL
Anion gap: 8 (ref 5–15)
BUN: 41 mg/dL — ABNORMAL HIGH (ref 6–20)
CO2: 33 mmol/L — ABNORMAL HIGH (ref 22–32)
Calcium: 8.7 mg/dL — ABNORMAL LOW (ref 8.9–10.3)
Chloride: 100 mmol/L (ref 98–111)
Creatinine, Ser: 1.72 mg/dL — ABNORMAL HIGH (ref 0.61–1.24)
GFR calc Af Amer: 51 mL/min — ABNORMAL LOW (ref >60)
GFR calc non Af Amer: 44 mL/min — ABNORMAL LOW (ref >60)
Glucose, Bld: 117 mg/dL — ABNORMAL HIGH (ref 70–99)
Potassium: 5 mmol/L (ref 3.5–5.1)
Sodium: 141 mmol/L (ref 135–145)

## 2019-01-22 LAB — PROTIME-INR
INR: 1.4 — ABNORMAL HIGH (ref 0.8–1.2)
Prothrombin Time: 17.4 seconds — ABNORMAL HIGH (ref 11.4–15.2)

## 2019-01-22 LAB — HEPARIN LEVEL (UNFRACTIONATED): Heparin Unfractionated: 0.25 IU/mL — ABNORMAL LOW (ref 0.30–0.70)

## 2019-01-22 LAB — GLUCOSE, CAPILLARY: Glucose-Capillary: 131 mg/dL — ABNORMAL HIGH (ref 70–99)

## 2019-01-22 LAB — UREA NITROGEN, URINE: Urea Nitrogen, Ur: 659 mg/dL

## 2019-01-22 MED ORDER — HEPARIN (PORCINE) 25000 UT/250ML-% IV SOLN
2650.0000 [IU]/h | INTRAVENOUS | Status: DC
  Administered 2019-01-22: 1800 [IU]/h via INTRAVENOUS
  Administered 2019-01-23: 2400 [IU]/h via INTRAVENOUS
  Administered 2019-01-23: 2100 [IU]/h via INTRAVENOUS
  Administered 2019-01-24: 2700 [IU]/h via INTRAVENOUS
  Administered 2019-01-24 – 2019-01-25 (×2): 2650 [IU]/h via INTRAVENOUS
  Filled 2019-01-22 (×7): qty 250

## 2019-01-22 MED ORDER — FUROSEMIDE 10 MG/ML IJ SOLN
80.0000 mg | Freq: Two times a day (BID) | INTRAMUSCULAR | Status: DC
  Administered 2019-01-22 – 2019-01-24 (×4): 80 mg via INTRAVENOUS
  Filled 2019-01-22 (×4): qty 8

## 2019-01-22 MED ORDER — HEPARIN BOLUS VIA INFUSION
2000.0000 [IU] | Freq: Once | INTRAVENOUS | Status: AC
  Administered 2019-01-22: 2000 [IU] via INTRAVENOUS
  Filled 2019-01-22: qty 2000

## 2019-01-22 MED ORDER — WARFARIN SODIUM 5 MG PO TABS
9.0000 mg | ORAL_TABLET | Freq: Once | ORAL | Status: AC
  Administered 2019-01-22: 9 mg via ORAL
  Filled 2019-01-22 (×2): qty 1

## 2019-01-22 MED ORDER — SODIUM CHLORIDE 0.9 % IV SOLN
100.0000 mg | Freq: Two times a day (BID) | INTRAVENOUS | Status: DC
  Administered 2019-01-22 – 2019-01-24 (×6): 100 mg via INTRAVENOUS
  Filled 2019-01-22 (×10): qty 100

## 2019-01-22 MED ORDER — HEPARIN BOLUS VIA INFUSION
6000.0000 [IU] | Freq: Once | INTRAVENOUS | Status: AC
  Administered 2019-01-22: 6000 [IU] via INTRAVENOUS
  Filled 2019-01-22: qty 6000

## 2019-01-22 NOTE — Progress Notes (Signed)
ABG results reported to MD. Patient placed on bipap 16/6 40% and is being transferred to a different floor.

## 2019-01-22 NOTE — Progress Notes (Signed)
ANTICOAGULATION CONSULT NOTE  Pharmacy Consult for Coumadin Indication: atrial fibrillation  No Known Allergies  Patient Measurements: Height: 6' (182.9 cm) Weight: (!) 479 lb 11.2 oz (217.6 kg) IBW/kg (Calculated) : 77.6  Vital Signs: Temp: 98.6 F (37 C) (06/06 0936) Temp Source: Oral (06/06 0936) BP: 119/73 (06/06 0936) Pulse Rate: 101 (06/06 0936)  Labs: Recent Labs    01/21/19 0510 01/21/19 0811 01/21/19 1852 01/22/19 0518  HGB  --  14.1  --   --   HCT  --  45.0  --   --   PLT  --  158  --   --   LABPROT  --   --  18.7* 17.4*  INR  --   --  1.6* 1.4*  CREATININE 2.35*  --   --  1.72*    Estimated Creatinine Clearance: 91.7 mL/min (A) (by C-G formula based on SCr of 1.72 mg/dL (H)).   Medical History: Past Medical History:  Diagnosis Date  . Atrial fibrillation, permanent    Failed DCCV 04/2008  . CHF (congestive heart failure) (HCC)   . Chronic anticoagulation    on coumadin  . Dysrhythmia    A FIB - FOLLOWED BY DR. HILTY  . History of gout   . Hypertension   . Morbid obesity with BMI of 60.0-69.9, adult (HCC)   . Nonischemic cardiomyopathy (HCC)     Last Echo 01/28/2008, EF 20%, last cardiac cath 2009 ( Lt & Rt) with normal cors  . OSA (obstructive sleep apnea)    HAS NOT BEEN SET UP WITH C-PAP YET     Medications:  No current facility-administered medications on file prior to encounter.    Current Outpatient Medications on File Prior to Encounter  Medication Sig Dispense Refill  . allopurinol (ZYLOPRIM) 300 MG tablet Take 1 tablet (300 mg total) by mouth daily. 30 tablet 4  . aspirin EC 81 MG tablet Take 81 mg by mouth daily.    . calcium carbonate (OS-CAL - DOSED IN MG OF ELEMENTAL CALCIUM) 1250 (500 Ca) MG tablet Take 1 tablet by mouth 2 (two) times daily with a meal.    . carvedilol (COREG) 12.5 MG tablet Take 1 tablet (12.5 mg total) by mouth 2 (two) times daily with a meal. 180 tablet 3  . diltiazem (CARDIZEM CD) 240 MG 24 hr capsule Take 1  capsule (240 mg total) by mouth daily. 90 capsule 3  . furosemide (LASIX) 40 MG tablet TAKE 2 TABLETS (80MG ) BY MOUTH IN THE AM AND 1 TABLET (40MG ) IN THE PM. (Patient taking differently: Take 40-80 mg by mouth See admin instructions. TAKE 2 TABLETS (80MG ) BY MOUTH IN THE AM AND 1 TABLET (40MG ) IN THE PM.) 90 tablet 11  . Multiple Vitamin (MULTIVITAMIN) tablet Take 1 tablet by mouth daily.    . ramipril (ALTACE) 5 MG capsule Take 1 capsule (5 mg total) by mouth daily. Please keep your upcoming appointment for refills. 90 capsule 3  . spironolactone (ALDACTONE) 25 MG tablet Take 1 tablet (25 mg total) by mouth daily. 90 tablet 3  . traZODone (DESYREL) 50 MG tablet Take 50-100 mg by mouth at bedtime as needed for sleep.     Marland Kitchen triamcinolone cream (KENALOG) 0.1 % Apply 1 application topically daily.     Marland Kitchen warfarin (COUMADIN) 5 MG tablet TAKE 1 TO 1 & 1/2 TABLETS BY MOUTH DAILY AS DIRECTED. NEED INR (Patient taking differently: Take 5-7.5 mg by mouth See admin instructions. Take 1 tablet (5mg )  on Sun, Tues, Wed, Thu, and Sat. Take 1 1/2 tablet (7.5mg ) on Mon and Fri.) 30 tablet 0     Assessment: 58 y.o. male with admitted with CHF and AKI, h/o Afib, to continue Coumadin. INR at [facility] ED 1.7. Coumadin home dose: 5 mg daily except 7.5 mg Mon and Fri  INR remains subtherapeutic, down to 1.4 today. Of note, doxycycline ordered to start 6/6, which may increase INR (no doses given yet). Will give increased dose of Coumadin tonight.  Goal of Therapy:  INR 2-3 Monitor platelets by anticoagulation protocol: Yes   Plan:  Coumadin 9 mg PO x1 tonight Daily INR   Jackson Latino, PharmD PGY1 Pharmacy Resident Phone [phone] 01/22/2019     10:34 AM

## 2019-01-22 NOTE — Progress Notes (Signed)
Progress Note  Patient Name: Travis Bautista Date of Encounter: 01/22/2019  Primary Cardiologist: Pixie Casino, MD   Subjective   Developed worsening respiratory failure and mental status changes this am and now transferred to 6e on BiPAP.  ABG PO2 71 and pCO2 86 with PH 7.23.  PO2 now 92 but Pco2 only down to 77 on biPAP.    Inpatient Medications    Scheduled Meds:  aspirin EC  81 mg Oral Daily   sodium chloride flush  3 mL Intravenous Q12H   warfarin  9 mg Oral ONCE-1800   Warfarin - Pharmacist Dosing Inpatient   Does not apply q1800   Continuous Infusions:  sodium chloride 250 mL (01/21/19 1108)   cefTRIAXone (ROCEPHIN)  IV Stopped (01/22/19 0850)   doxycycline (VIBRAMYCIN) IV 100 mg (01/22/19 1100)   PRN Meds: sodium chloride, acetaminophen, ondansetron (ZOFRAN) IV, sodium chloride flush, traZODone   Vital Signs    Vitals:   01/22/19 0901 01/22/19 0936 01/22/19 1103 01/22/19 1119  BP:  119/73 123/76   Pulse: (!) 108 (!) 101 99 (!) 101  Resp: 20 16    Temp:  98.6 F (37 C)    TempSrc:  Oral    SpO2: 94% 99% 100% 100%  Weight:      Height:        Intake/Output Summary (Last 24 hours) at 01/22/2019 1153 Last data filed at 01/22/2019 1111 Gross per 24 hour  Intake 177 ml  Output 950 ml  Net -773 ml   Filed Weights   01/21/19 0418 01/22/19 0631  Weight: (!) 221.2 kg (!) 217.6 kg    Telemetry    NSR - Personally Reviewed  ECG    No new EKG to review - Personally Reviewed  Physical Exam   GEN: breathing improved on BIPAP Neck: positive for JVD Cardiac: RRR, no murmurs, rubs, or gallops.  Respiratory: Clear to auscultation bilaterally. GI: Soft, nontender, non-distended  MS: pitting edema present Neuro:  Nonfocal  Psych: Normal affect   Labs    Chemistry Recent Labs  Lab 01/21/19 0510 01/22/19 0518  NA 138 141  K 4.9 5.0  CL 93* 100  CO2 33* 33*  GLUCOSE 101* 117*  BUN 42* 41*  CREATININE 2.35* 1.72*  CALCIUM 9.1 8.7*  GFRNONAA  30* 44*  GFRAA 35* 51*  ANIONGAP 12 8     Hematology Recent Labs  Lab 01/21/19 0811  WBC 7.2  RBC 4.39  HGB 14.1  HCT 45.0  MCV 102.5*  MCH 32.1  MCHC 31.3  RDW 14.6  PLT 158    Cardiac EnzymesNo results for input(s): TROPONINI in the last 168 hours. No results for input(s): TROPIPOC in the last 168 hours.   BNPNo results for input(s): BNP, PROBNP in the last 168 hours.   DDimer No results for input(s): DDIMER in the last 168 hours.   Radiology    Ct Head Wo Contrast  Result Date: 01/21/2019 CLINICAL DATA:  Unexplained altered level of consciousness. Morbid obesity. EXAM: CT HEAD WITHOUT CONTRAST TECHNIQUE: Contiguous axial images were obtained from the base of the skull through the vertex without intravenous contrast. COMPARISON:  None. FINDINGS: Brain: No evidence of acute infarction, hemorrhage, hydrocephalus, extra-axial collection or mass lesion/mass effect. Normal for age cerebral volume. No white matter disease. Vascular: Calcification of the cavernous internal carotid arteries consistent with cerebrovascular atherosclerotic disease. No signs of intracranial large vessel occlusion. Skull: Normal. Negative for fracture or focal lesion. Sinuses/Orbits: No acute finding. Retention  cyst LEFT maxillary sinus. Unremarkable orbits. Other: None. IMPRESSION: Negative exam. Electronically Signed   By: Elsie Stain M.D.   On: 01/21/2019 22:33   US Renal  Result Date: 01/21/2019 CLINICAL DATA:  Acute kidney injury EXAM: RENAL ULTRASOUND COMPARISON:  None. FINDINGS: Right Kidney: Renal measurements: 10.1 x 5.7 x 6.4 cm = volume: 188.7 mL . Echogenicity is within normal limits. Renal cortical thickness is low normal. Within normal limits. No mass, perinephric fluid, or hydronephrosis visualized. No sonographically demonstrable calculus or ureterectasis. Left Kidney: Renal measurements: 12.2 x 5.8 x 6.3 cm = volume: 232.1 mL. Echogenicity and renal cortical thickness are within normal  limits. No mass, perinephric fluid, or hydronephrosis visualized. No sonographically demonstrable calculus or ureterectasis. Bladder: Empty and could not be assessed. IMPRESSION: No obstructing focus evident in either kidney. Renal echogenicity normal bilaterally. Renal cortical thickness is within normal limits, although renal cortical thickness on the right is at the lower limits of normal. Electronically Signed   By: Bretta Bang III M.D.   On: 01/21/2019 21:56   Dg Chest Port 1 View  Result Date: 01/21/2019 CLINICAL DATA:  Hypotension. EXAM: PORTABLE CHEST 1 VIEW COMPARISON:  06/08/2015 and 10/23/2014 FINDINGS: Chronic cardiomegaly. Slight pulmonary vascular prominence. No discrete infiltrates or effusions. No acute bone abnormality. IMPRESSION: Mild cardiomegaly with slight pulmonary vascular congestion. Electronically Signed   By: Francene Boyers M.D.   On: 01/21/2019 12:08    Cardiac Studies   2D echo IMPRESSIONS    1. The left ventricle has normal systolic function, with an ejection fraction of 55-60%. The cavity size was normal. Left ventricular diastolic Doppler parameters are indeterminate.  2. RV not seen well enough to evaluate function. Poor acoustic windows limit study.  3. The tricuspid valve is grossly normal.  4. The aortic valve is abnormal. Mild thickening of the aortic valve. Mild calcification of the aortic valve.  5. The aortic root and ascending aorta are normal in size and structure.  6. The inferior vena cava was dilated in size with <50% respiratory variability.  7. The left ventricular function has unchanged.  8. The interatrial septum was not well visualized.  Patient Profile     58 y.o. male male with a hx of morbid obesity with a BMI >60, nonischemic cardiomyopathy now with improved LV function per echocardiogram, permanent atrial fibrillation on chronic Coumadin therapy, hypertension and gout who is being seen today for the evaluation of CHF exacerbation  at the request of Dr. Sunnie Nielsen.  Assessment & Plan    1.  Acute respiratory failure secondary to acute on chronic heart failure versus CAP -Patient presented to [facility] with progressively worsening hypoxia and shortness of breath after stopping his evening dose of Lasix 2 weeks prior to presentation secondary to frequent nocturnal urination -BNP on initial lab work was markedly elevated at 3351 with CXR revealing cardiomegaly and mild bibasilar opacities -He has a prior history of nonischemic cardiomyopathy from 2009 with an initial LVEF of 20-25% thought to be secondary to atrial fibrillation. He had a follow up echocardiogram in 2015 that showed grossly normal LV function.  -Repeat echo from this admission with normal LV function EF 55 to 60% -IV Lasix on hold yesterday due to hypotension  -Also with AKI on presentation with a creatinine of 2.35>>have called [facility] for more up to date BMET if available  -Creatinine improved today from 2.35-1.72. -Home meds: carvedilol 12.5, Lasix 80 in the AM 40 in the PM, ramipril 5 spironolactone 25  all on hold due to hypotension and worsening renal function yesterday -He put out 650 cc yesterday and is net +58 cc. -Would continue to hold diuretics for now as renal function improving  2.  Persistent atrial fibrillation on Coumadin therapy: -Patient has known persistent/chronic atrial fibrillation datuing back many years -He has failed DCCV in the past and remains fairly asymptomatic with rate controlled AF -Initially controlled on digoxin and amiodarone however these have since been discontinued many years ago -Currently fairly well rate controlled without home carvedilol or diltiazem -BP is improved today -likely at some point will need to restart BB or CCB but right now HR 95-15800mmHg. -On chronic coumadin therapy and [facility] -INR on presentation subtherapeutic at 1.2.  INR today 1.4 -Coumadin  dosed per pharmacy for CHA2DS2VASc =2 (CHF and hypertension) -recommend starting IV Heparin gtt until INR therapeutic - per pharmacy  3.  History of nonischemic cardiomyopathy with improved LV function: -Cardiac catheterization from 2009 with non-ischemic cardiomyopathy in the setting of uncontrolled atrial fibrillation with an EF of 20-25%  -Repeat echocardiogram from 2015 with improved LV function to 55% -Fluid volume maintained on high dose diuretics with PO Lasix 80mg  in AM and 40mg  in PM  -Creatinine found to be elevated on presentation at 2.35 -Baseline appears to be in the 1.1-1.2 range however last results from 2016 -diuretics on hold  4.  Hypertension: -Was hypotensive yesterday 100/53>78/50>77/43>86/53>102/59 -Antihypertensives on hold and BP improved today  5. AKI: -Creatinine, 2.35 on presentation with a baseline in the 1.1-1.2 range from 2016. -ACEI and Lasix on hold  -Creatinine improved today -nephrology following  6. OSA: -Reports that he is a poor sleeper however does report compliance with CPAP   7. Bilateral bibasilar opacities on CXR: -Treated with empiric IV antibiotics -No leukocytosis or fever  8.  Chronic lower extremity edema: -Has chronic lower extremity edema in the setting of venous stasis secondary to morbid obesity -Continue with bilateral compression stockings/wraps -Reports good fluid volume management with Lasix 80 mg in a.m., 40 mg in p.m at home -Currently diuretics on hold due to soft BP and worsening renal function yesterday. -Would continue to hold today since renal function is improving and continue to use compression hose/wraps      For questions or updates, please contact [facility] Please consult [email or web address] for contact info under Cardiology/STEMI.      Signed, Armanda Magicraci Rufino Staup, MD  01/22/2019, 11:53 AM

## 2019-01-22 NOTE — Consult Note (Signed)
[facility] Renal Consultation Note  Requesting MD: Regalado Indication for Consultation: AKI  HPI:  Travis JensenJames Bautista is a 58 y.o. male with PMhx significant for massive obesity, non ischemic cardiomyopathy with Afib on cardizem, coreg and coumadin.  He presented on 6/5 with c/o SOB-  was found to be hypoxic, in afib with soft BP, CXR with bibasilar opacities and elevated troponin, hypercarbic by ABG.  He had given the history that he was non compliant with his evening lasix prior to admission due to polyuria.  He was treated with lasix and antibiotics in the ER.  Also of note, crt was 2.3 where it had never appeared to be elevated in the past but last records were from 2016. Overnight, pt with not a huge amount of UOP- 650 last 24 hours.  Further Lasix was held due to low BP- home coreg, cardizem and aldactone have been held.  BP in the low 100's today - HR of 103  - was found to be more hypercarbic this AM and placed on bipap BUT  renal function seems better- crt down to 1.7.  Urine without protein or RBC but does have WBC- already on ceftriaxone.  He is bright and cheery when I see him today on bipap  - he does have pitting edema to dep areas  Creatinine, Ser  Date/Time Value Ref Range Status  01/22/2019 05:18 AM 1.72 (H) 0.61 - 1.24 mg/dL Final  16/10/960406/12/2018 54:0905:10 AM 2.35 (H) 0.61 - 1.24 mg/dL Final  81/19/147812/07/2015 29:5608:10 PM 0.88 0.61 - 1.24 mg/dL Final  21/30/865712/03/2015 84:6910:00 AM 0.98 0.61 - 1.24 mg/dL Final  62/95/284103/06/2015 32:4405:58 AM 1.03 0.50 - 1.35 mg/dL Final  01/02/725303/05/2015 66:4405:30 AM 0.98 0.50 - 1.35 mg/dL Final    Comment:    DELTA CHECK NOTED REPEATED TO VERIFY   10/25/2014 05:35 AM 1.59 (H) 0.50 - 1.35 mg/dL Final  03/47/425903/03/2015 56:3805:35 AM 2.38 (H) 0.50 - 1.35 mg/dL Final    Comment:    REPEATED TO VERIFY DELTA CHECK NOTED   10/23/2014 06:50 AM 1.16 0.50 - 1.35 mg/dL Final  75/64/332903/02/2015 51:8812:15 AM 1.18 0.50 - 1.35 mg/dL Final  41/66/063006/21/2009 16:0104:20 AM 1.07  Final  02/04/2008 03:45 AM 1.01  Final   02/03/2008 04:10 AM 1.07  Final  02/02/2008 03:50 AM 1.03  Final  02/01/2008 01:00 AM 1.06  Final  01/31/2008 05:35 AM 1.04  Final  01/30/2008 03:30 AM 1.11  Final  01/29/2008 04:00 AM 1.26  Final  01/28/2008 04:30 AM 1.27  Final  01/27/2008 03:10 PM 1.04  Final     PMHx:   Past Medical History:  Diagnosis Date  . Atrial fibrillation, permanent    Failed DCCV 04/2008  . CHF (congestive heart failure) (HCC)   . Chronic anticoagulation    on coumadin  . Dysrhythmia    A FIB - FOLLOWED BY DR. HILTY  . History of gout   . Hypertension   . Morbid obesity with BMI of 60.0-69.9, adult (HCC)   . Nonischemic cardiomyopathy (HCC)     Last Echo 01/28/2008, EF 20%, last cardiac cath 2009 ( Lt & Rt) with normal cors  . OSA (obstructive sleep apnea)    HAS NOT BEEN SET UP WITH C-PAP YET     Past Surgical History:  Procedure Laterality Date  . CARDIAC CATHETERIZATION  01/31/2008   patent cors, CO/CI 4.8/1.6, PA 65/33 mean 43; PCW 35/41, mean 35; Ra 31/35; RV mean31  . CARDIOVERSION  may 2009  . LAPAROSCOPIC GASTRIC  SLEEVE RESECTION N/A 07/30/2015   Procedure: LAPAROSCOPIC GASTRIC SLEEVE RESECTION WITH UPPER ENDOSCOPY;  Surgeon: De Blanch Kinsinger, MD;  Location: [facility];  Service: General;  Laterality: N/A;    Family Hx: History reviewed. No pertinent family history.  Social History:  reports that he has never smoked. He quit smokeless tobacco use about 21 years ago.  His smokeless tobacco use included chew. He reports that he does not drink alcohol or use drugs.  Allergies: No Known Allergies  Medications: Prior to Admission medications   Medication Sig Start Date End Date Taking? Authorizing Provider  allopurinol (ZYLOPRIM) 300 MG tablet Take 1 tablet (300 mg total) by mouth daily. 10/27/14  Yes Rai, Ripudeep K, MD  aspirin EC 81 MG tablet Take 81 mg by mouth daily.   Yes [provider]  calcium carbonate (OS-CAL - DOSED IN MG OF ELEMENTAL CALCIUM) 1250 (500 Ca) MG  tablet Take 1 tablet by mouth 2 (two) times daily with a meal.   Yes [provider]  carvedilol (COREG) 12.5 MG tablet Take 1 tablet (12.5 mg total) by mouth 2 (two) times daily with a meal. 10/12/18  Yes Hilty, Lisette Abu, MD  diltiazem (CARDIZEM CD) 240 MG 24 hr capsule Take 1 capsule (240 mg total) by mouth daily. 10/12/18  Yes Hilty, Lisette Abu, MD  furosemide (LASIX) 40 MG tablet TAKE 2 TABLETS (80MG ) BY MOUTH IN THE AM AND 1 TABLET (40MG ) IN THE PM. Patient taking differently: Take 40-80 mg by mouth See admin instructions. TAKE 2 TABLETS (80MG ) BY MOUTH IN THE AM AND 1 TABLET (40MG ) IN THE PM. 09/21/18  Yes Hilty, Lisette Abu, MD  Multiple Vitamin (MULTIVITAMIN) tablet Take 1 tablet by mouth daily.   Yes [provider]  ramipril (ALTACE) 5 MG capsule Take 1 capsule (5 mg total) by mouth daily. Please keep your upcoming appointment for refills. 12/06/18  Yes Hilty, Lisette Abu, MD  spironolactone (ALDACTONE) 25 MG tablet Take 1 tablet (25 mg total) by mouth daily. 11/03/18  Yes Hilty, Lisette Abu, MD  traZODone (DESYREL) 50 MG tablet Take 50-100 mg by mouth at bedtime as needed for sleep.  09/16/18  Yes [provider]  triamcinolone cream (KENALOG) 0.1 % Apply 1 application topically daily.  09/17/18  Yes [provider]  warfarin (COUMADIN) 5 MG tablet TAKE 1 TO 1 & 1/2 TABLETS BY MOUTH DAILY AS DIRECTED. NEED INR Patient taking differently: Take 5-7.5 mg by mouth See admin instructions. Take 1 tablet (5mg ) on Sun, Tues, Wed, Thu, and Sat. Take 1 1/2 tablet (7.5mg ) on Mon and Fri. 06/17/18  Yes Hilty, Lisette Abu, MD    I have reviewed the patient's current medications.  Labs:  Results for orders placed or performed during the hospital encounter of 01/21/19 (from the past 48 hour(s))  Sodium, urine, random     Status: None   Collection Time: 01/21/19  4:33 AM  Result Value Ref Range   Sodium, Ur 26 mmol/L    Comment: Performed at [facility], [address], [place], [place] [address]  Urea nitrogen, urine     Status: None   Collection Time: 01/21/19  4:33 AM  Result Value Ref Range   Urea Nitrogen, Ur 659 Not Estab. mg/dL    Comment: (NOTE) Performed At: [facility] [address] [place], [place] [id number] Jolene Schimke MD [id number]   Creatinine, urine, random     Status: None   Collection Time: 01/21/19  4:33 AM  Result Value Ref Range   Creatinine, Urine 223.49 mg/dL    Comment: Performed at [facility], [address] [place], [place] [address]  Urinalysis, Complete w Microscopic     Status: Abnormal   Collection Time: 01/21/19  4:33 AM  Result Value Ref Range   Color, Urine YELLOW YELLOW   APPearance HAZY (A) CLEAR   Specific Gravity, Urine 1.016 1.005 - 1.030   pH 5.0 5.0 - 8.0   Glucose, UA NEGATIVE NEGATIVE mg/dL   Hgb urine dipstick NEGATIVE NEGATIVE   Bilirubin Urine NEGATIVE NEGATIVE   Ketones, ur NEGATIVE NEGATIVE mg/dL   Protein, ur 30 (A) NEGATIVE mg/dL   Nitrite NEGATIVE NEGATIVE   Leukocytes,Ua MODERATE (A) NEGATIVE   RBC / HPF 0-5 0 - 5 RBC/hpf   WBC, UA >50 (H) 0 - 5 WBC/hpf   Bacteria, UA FEW (A) NONE SEEN   Squamous Epithelial / LPF 6-10 0 - 5   WBC Clumps PRESENT    Mucus PRESENT    Hyaline Casts, UA PRESENT     Comment: Performed at [facility], [address] [place], [place] [address]  SARS Coronavirus 2 (CEPHEID - Performed in [facility] hospital lab), Hosp Order     Status: None   Collection Time: 01/21/19  4:58 AM  Result Value Ref Range   SARS Coronavirus 2 NEGATIVE NEGATIVE    Comment: (NOTE) If result is NEGATIVE SARS-CoV-2 target nucleic acids are NOT DETECTED. The SARS-CoV-2 RNA is generally detectable in upper and lower  respiratory specimens during the acute phase of infection. The lowest  concentration of SARS-CoV-2 viral copies this assay can detect is 250  copies / mL. A negative result does not preclude SARS-CoV-2 infection  and should not be used as the  sole basis for treatment or other  patient management decisions.  A negative result may occur with  improper specimen collection / handling, submission of specimen other  than nasopharyngeal swab, presence of viral mutation(s) within the  areas targeted by this assay, and inadequate number of viral copies  (<250 copies / mL). A negative result must be combined with clinical  observations, patient history, and epidemiological information. If result is POSITIVE SARS-CoV-2 target nucleic acids are DETECTED. The SARS-CoV-2 RNA is generally detectable in upper and lower  respiratory specimens dur ing the acute phase of infection.  Positive  results are indicative of active infection with SARS-CoV-2.  Clinical  correlation with patient history and other diagnostic information is  necessary to determine patient infection status.  Positive results do  not rule out bacterial infection or co-infection with other viruses. If result is PRESUMPTIVE POSTIVE SARS-CoV-2 nucleic acids MAY BE PRESENT.   A presumptive positive result was obtained on the submitted specimen  and confirmed on repeat testing.  While 2019 novel coronavirus  (SARS-CoV-2) nucleic acids may be present in the submitted sample  additional confirmatory testing may be necessary for epidemiological  and / or clinical management purposes  to differentiate between  SARS-CoV-2 and other Sarbecovirus currently known to infect humans.  If clinically indicated additional testing with an alternate test  methodology [phone]) is advised. The SARS-CoV-2 RNA is generally  detectable in upper and lower respiratory sp ecimens during the acute  phase of infection. The expected result is Negative. Fact Sheet for Patients:  BoilerBrush.com.cy Fact Sheet for Healthcare Providers: https://pope.com/ This test is not yet approved or cleared by the [place] FDA and has been authorized for detection  and/or diagnosis of SARS-CoV-2 by FDA under an Emergency Use Authorization (EUA).  This EUA will remain in effect (meaning this test can be used) for the duration of the COVID-19 declaration under Section 564(b)(1) of the Act, 21 U.S.C. section 360bbb-3(b)(1), unless the authorization is terminated or revoked sooner. Performed at [facility], [address] [place], [place] [address]   HIV antibody (Routine Testing)     Status: None   Collection Time: 01/21/19  5:10 AM  Result Value Ref Range   HIV Screen 4th Generation wRfx Non Reactive Non Reactive    Comment: (NOTE) Performed At: [facility] [address] [place], [place] [id number] Jolene SchimkeNagendra Sanjai MD XB:[id number]:[phone]   Basic metabolic panel     Status: Abnormal   Collection Time: 01/21/19  5:10 AM  Result Value Ref Range   Sodium 138 135 - 145 mmol/L   Potassium 4.9 3.5 - 5.1 mmol/L   Chloride 93 (L) 98 - 111 mmol/L   CO2 33 (H) 22 - 32 mmol/L   Glucose, Bld 101 (H) 70 - 99 mg/dL   BUN 42 (H) 6 - 20 mg/dL   Creatinine, Ser 3.082.35 (H) 0.61 - 1.24 mg/dL   Calcium 9.1 8.9 - 65.710.3 mg/dL   GFR calc non Af Amer 30 (L) >60 mL/min   GFR calc Af Amer 35 (L) >60 mL/min   Anion gap 12 5 - 15    Comment: Performed at [facility], [address] [place], [place] [address]  Procalcitonin - Baseline     Status: None   Collection Time: 01/21/19  5:10 AM  Result Value Ref Range   Procalcitonin <0.10 ng/mL    Comment:        Interpretation: PCT (Procalcitonin) <= 0.5 ng/mL: Systemic infection (sepsis) is not likely. Local bacterial infection is possible. (NOTE)       Sepsis PCT Algorithm           Lower Respiratory Tract                                      Infection PCT Algorithm    ----------------------------     ----------------------------         PCT < 0.25 ng/mL                PCT < 0.10 ng/mL         Strongly encourage             Strongly discourage   discontinuation of antibiotics    initiation of  antibiotics    ----------------------------     -----------------------------       PCT 0.25 - 0.50 ng/mL            PCT 0.10 - 0.25 ng/mL               OR       >80% decrease in PCT            Discourage initiation of                                            antibiotics      Encourage discontinuation           of antibiotics    ----------------------------     -----------------------------  PCT >= 0.50 ng/mL              PCT 0.26 - 0.50 ng/mL               AND        <80% decrease in PCT             Encourage initiation of                                             antibiotics       Encourage continuation           of antibiotics    ----------------------------     -----------------------------        PCT >= 0.50 ng/mL                  PCT > 0.50 ng/mL               AND         increase in PCT                  Strongly encourage                                      initiation of antibiotics    Strongly encourage escalation           of antibiotics                                     -----------------------------                                           PCT <= 0.25 ng/mL                                                 OR                                        > 80% decrease in PCT                                     Discontinue / Do not initiate                                             antibiotics Performed at [facility], [address] [place], [place] [address]   CBC     Status: Abnormal   Collection Time: 01/21/19  8:11 AM  Result Value Ref Range   WBC 7.2 4.0 - 10.5 K/uL   RBC 4.39 4.22 - 5.81 MIL/uL   Hemoglobin 14.1 13.0 - 17.0 g/dL   HCT 45.0 39.0 - 52.0 %  MCV 102.5 (H) 80.0 - 100.0 fL   MCH 32.1 26.0 - 34.0 pg   MCHC 31.3 30.0 - 36.0 g/dL   RDW 16.114.6 09.611.5 - 04.515.5 %   Platelets 158 150 - 400 K/uL   nRBC 0.6 (H) 0.0 - 0.2 %    Comment: Performed at [facility], [address] [place], [place] [address]  Culture, blood (routine x 2)      Status: None (Preliminary result)   Collection Time: 01/21/19 11:24 AM  Result Value Ref Range   Specimen Description BLOOD LEFT ANTECUBITAL    Special Requests      BOTTLES DRAWN AEROBIC ONLY Blood Culture adequate volume   Culture      NO GROWTH < 24 HOURS Performed at [facility], [address] [place], [place] [address]    Report Status PENDING   Culture, blood (routine x 2)     Status: None (Preliminary result)   Collection Time: 01/21/19 11:30 AM  Result Value Ref Range   Specimen Description BLOOD LEFT ANTECUBITAL    Special Requests      BOTTLES DRAWN AEROBIC ONLY Blood Culture adequate volume   Culture      NO GROWTH < 24 HOURS Performed at [facility], [address] [place], [place] [address]    Report Status PENDING   Lactic acid, plasma     Status: None   Collection Time: 01/21/19 11:31 AM  Result Value Ref Range   Lactic Acid, Venous 0.9 0.5 - 1.9 mmol/L    Comment: Performed at [facility], [address] [place], [place] [address]  Protime-INR     Status: Abnormal   Collection Time: 01/21/19  6:52 PM  Result Value Ref Range   Prothrombin Time 18.7 (H) 11.4 - 15.2 seconds   INR 1.6 (H) 0.8 - 1.2    Comment: (NOTE) INR goal varies based on device and disease states. Performed at [facility], [address] [place], [place] [address]   Glucose, capillary     Status: Abnormal   Collection Time: 01/21/19  8:34 PM  Result Value Ref Range   Glucose-Capillary 131 (H) 70 - 99 mg/dL  Basic metabolic panel     Status: Abnormal   Collection Time: 01/22/19  5:18 AM  Result Value Ref Range   Sodium 141 135 - 145 mmol/L   Potassium 5.0 3.5 - 5.1 mmol/L   Chloride 100 98 - 111 mmol/L   CO2 33 (H) 22 - 32 mmol/L   Glucose, Bld 117 (H) 70 - 99 mg/dL   BUN 41 (H) 6 - 20 mg/dL   Creatinine, Ser 6.961.72 (H) 0.61 - 1.24 mg/dL   Calcium 8.7 (L) 8.9 - 10.3 mg/dL   GFR calc non Af Amer 44 (L) >60 mL/min   GFR calc Af Amer 51 (L) >60 mL/min    Anion gap 8 5 - 15    Comment: Performed at [facility], [address] [place], [place] [address]  Protime-INR     Status: Abnormal   Collection Time: 01/22/19  5:18 AM  Result Value Ref Range   Prothrombin Time 17.4 (H) 11.4 - 15.2 seconds   INR 1.4 (H) 0.8 - 1.2    Comment: (NOTE) INR goal varies based on device and disease states. Performed at [facility], [address] [place], [place] [address]   Blood gas, arterial     Status: Abnormal  Collection Time: 01/22/19  8:30 AM  Result Value Ref Range   O2 Content 2.0 L/min   Delivery systems NASAL CANNULA    pH, Arterial 7.239 (L) 7.350 - 7.450   pCO2 arterial 82.2 (HH) 32.0 - 48.0 mmHg    Comment: CRITICAL RESULT CALLED TO, READ BACK BY AND VERIFIED WITH:  REGALADO MD AT 0840 BY KAYLA HACKER RRT 01/22/19    pO2, Arterial 71.8 (L) 83.0 - 108.0 mmHg   Bicarbonate 33.8 (H) 20.0 - 28.0 mmol/L   Acid-Base Excess 6.7 (H) 0.0 - 2.0 mmol/L   O2 Saturation 92.5 %   Patient temperature 98.9    Collection site RADIAL    Drawn by [id number]    Sample type ARTERIAL    Allens test (pass/fail) PASS PASS  Blood gas, arterial     Status: Abnormal   Collection Time: 01/22/19 12:29 PM  Result Value Ref Range   FIO2 40.00    Delivery systems BILEVEL POSITIVE AIRWAY PRESSURE    Inspiratory PAP 16    Expiratory PAP 6    pH, Arterial 7.283 (L) 7.350 - 7.450   pCO2 arterial 77.0 (HH) 32.0 - 48.0 mmHg    Comment: RBV  RBV YOKI MAI, RN AT 1251 ON 01/22/2019 BY ASHLEY DICKENS, RRT    pO2, Arterial 92.1 83.0 - 108.0 mmHg   Bicarbonate 35.3 (H) 20.0 - 28.0 mmol/L   Acid-Base Excess 8.7 (H) 0.0 - 2.0 mmol/L   O2 Saturation 96.8 %   Patient temperature 98.6    Collection site RIGHT RADIAL    Drawn by [id number]    Sample type ARTERIAL DRAW    Allens test (pass/fail) PASS PASS     ROS:  Pertinent items noted in HPI and remainder of comprehensive ROS otherwise negative.  Physical Exam: Vitals:   01/22/19 1103 01/22/19 1119  BP:  123/76   Pulse: 99 (!) 101  Resp:    Temp:    SpO2: 100% 100%     General:  Massively obese, on bipap- facetiming with family - no c/o's  HEENT: PERRLA, EOMI, mucous membranes moist  Neck: positive for JVD Heart: tachy  Lungs: CBS bilat  Abdomen: obese, abd wall edema Extremities: pitting edema to dep areas Skin: warm and dry Neuro: alert, non focal   Assessment/Plan: 58 year old WM with massive obesity and cardiomyopathy with likely chronic resp failure due to habitus- now presents with hypoxia, volume overload and AKI 1.Renal- Appears to be AKI although do not have reliable recent renal function data.  Seems to have trended better overnight maybe due to better hemodynamics off of BP meds.  Non oliguric and making urine- no dialysis indications- hopefully will continue to improve   2. Hypertension/volume  - volume overload leading to hypoxia and also hypercarbia- will choose dose of lasix 80 iv q 12  3. UTI- should be covered by abx already on  4. Anemia  - not an issue  5. Afib-  Will need to watch for worsening rate off of meds, likely will need to resume either the coreg or cardizem    Cecille Aver 01/22/2019, 12:56 PM

## 2019-01-22 NOTE — Progress Notes (Signed)
Pt's O2 sat dropped to 79% -82% while on bipap with good waves.  He was asleep and drowsy when I asked to wake up. Notified respiratory therapist to come and assess him. O2 sat came back up to 97% after about 5 minutes and he is alert and awake now.  Idolina Primer, RN

## 2019-01-22 NOTE — Progress Notes (Signed)
Orthopedic Tech Progress Note Patient Details:  Travis Bautista 05/17/1961 [id number]  Ortho Devices Type of Ortho Device: Haematologist Ortho Device/Splint Interventions: Adjustment, Application, Ordered   Post Interventions Patient Tolerated: Well Instructions Provided: Poper ambulation with device, Care of device, Adjustment of device   Melony Overly T 01/22/2019, 8:43 AM

## 2019-01-22 NOTE — Progress Notes (Signed)
[place] for Heparin Indication: atrial fibrillation  No Known Allergies  Patient Measurements: Height: 6' (182.9 cm) Weight: (!) 479 lb 11.2 oz (217.6 kg) IBW/kg (Calculated) : 77.6 Heparin Dosing Weight: 134.3 kg  Vital Signs: Temp: 98 F (36.7 C) (06/06 1455) Temp Source: Axillary (06/06 1455) BP: 128/80 (06/06 1455) Pulse Rate: 102 (06/06 1535)  Labs: Recent Labs    01/21/19 0510 01/21/19 0811 01/21/19 1852 01/22/19 0518  HGB  --  14.1  --   --   HCT  --  45.0  --   --   PLT  --  158  --   --   LABPROT  --   --  18.7* 17.4*  INR  --   --  1.6* 1.4*  CREATININE 2.35*  --   --  1.72*   Estimated Creatinine Clearance: 91.7 mL/min (A) (by C-G formula based on SCr of 1.72 mg/dL (H)).  Assessment: 81 yoM admitted for CHF exacerbation and AKI. PMH afib on warfarin PTA. Pharmacy already consulted to continue dosing warfarin, now consulted to start IV heparin bridge as INR below goal at 1.4. CBC WNL, no bleeding noted.  Goal of Therapy:  Heparin level 0.3-0.7 units/ml Monitor platelets by anticoagulation protocol: Yes   Plan:  Heparin bolus 6000 units IV x1 Start heparin gtt at 1800 units/hr Check heparin level in 6 hours Daily heparin level, INR, and CBC Monitor s/sx of bleeding  Erin N. Gerarda Fraction, PharmD, [place] PGY2 Infectious Diseases Pharmacy Resident Phone: [phone] 01/22/2019,3:36 PM

## 2019-01-22 NOTE — Progress Notes (Signed)
Patient transported to 6E09 on bipap. RT called and gave report. Follow up ABG ordered.

## 2019-01-22 NOTE — Progress Notes (Signed)
ANTICOAGULATION CONSULT NOTE  Pharmacy Consult for Heparin Indication: atrial fibrillation  No Known Allergies  Patient Measurements: Height: 6' (182.9 cm) Weight: (!) 479 lb 11.2 oz (217.6 kg) IBW/kg (Calculated) : 77.6 Heparin Dosing Weight: 134.3 kg  Vital Signs: Temp: 98.2 F (36.8 C) (06/06 2037) Temp Source: Oral (06/06 2037) BP: 127/54 (06/06 2037) Pulse Rate: 85 (06/06 2037)  Labs: Recent Labs    01/21/19 0510 01/21/19 0811 01/21/19 1852 01/22/19 0518 01/22/19 2159  HGB  --  14.1  --   --   --   HCT  --  45.0  --   --   --   PLT  --  158  --   --   --   LABPROT  --   --  18.7* 17.4*  --   INR  --   --  1.6* 1.4*  --   HEPARINUNFRC  --   --   --   --  0.25*  CREATININE 2.35*  --   --  1.72*  --    Estimated Creatinine Clearance: 91.7 mL/min (A) (by C-G formula based on SCr of 1.72 mg/dL (H)).  Assessment: 66 yoM admitted for CHF exacerbation and AKI. PMH afib on warfarin PTA. Pharmacy already consulted to continue dosing warfarin, now consulted to start IV heparin bridge as INR below goal at 1.4. CBC WNL, no bleeding noted.  6/6 PM update: heparin level below goal, no issues per RN.   Goal of Therapy:  Heparin level 0.3-0.7 units/ml Monitor platelets by anticoagulation protocol: Yes   Plan:  Heparin 2000 units [place] heparin to 2100 units/hr Check heparin level in 6 hours Daily heparin level, INR, and CBC Monitor s/sx of bleeding  Narda Bonds, PharmD, BCPS Clinical Pharmacist Phone: [phone]

## 2019-01-22 NOTE — Progress Notes (Signed)
PROGRESS NOTE    Travis Bautista  ZOX:[id number] DOB: 07/01/1961 DOA: 01/21/2019 PCP: Chilton GreathouseAvva, Ravisankar, MD    Brief Narrative: 58 year old with past medical history significant for obesity BMI more than 60, nonischemic cardiomyopathy, A. fib on Coumadin now presents complaining of shortness of breath.  Patient was noted to be hypoxic with oxygen sat measuring at home in the 70%.  He also stopped taking his evening basis.  He has not been compliant with INR monitoring.  [facility]: Patient was found to be afebrile oxygen saturation in the low 80s on room air, tachycardic.  Chest x-ray showed cardiomegaly with mild bibasilar opacity.  Cr 2.3, previously normal.  proBNP 3351.  ABG pH 7.3 PCO2 68 PO2 79.  Assessment & Plan:   Principal Problem:   Acute on chronic respiratory failure with hypoxia and hypercapnia (HCC) Active Problems:   NICM (nonischemic cardiomyopathy) (HCC)   OSA (obstructive sleep apnea)   HTN (hypertension)   AKI (acute kidney injury) (HCC)   Renal insufficiency   Acute on chronic systolic CHF (congestive heart failure) (HCC)   Atrial fibrillation, permanent   Subtherapeutic international normalized ratio (INR)   Elevated troponin   Acute respiratory failure with hypoxia and hypercapnia (HCC)   1-Acute hypoxic and hypercarbic respiratory failure, nonischemic cardiomyopathy: Acute diastolic Heart failure exacerbation.  Patient presented with shortness of breath hypoxia and he is stop his evening Lasix dose. Prior history of nonischemic cardiomyopathy with ejection fraction 20 to 25% that has improved to 55% per notes in 2016. -hold IV lasix, patient hypotension. Defer lasix to cardiology.  -ECHO EF 55%  -cardiology consulted for evaluation of possible cardiogenic shock.  -more confuse this am, ABG showed PH 7.2, PCO 84. Placed on BIPAP transfer to step down unit.  -repeat Blood gas in 2 hours.   2-Hypotension;  Asymptomatic.  Hold Cardizem, Coreg which  he has not received.  Hold lasix.  Concern for  cardiogenic shock. Cardiology consulted.  Received IV bolus times one.  Normal lactic acid.  Start doxy to cover for LE cellulitis.   3-AKI: Creatinine on admission at 2.3, most recent prior labs on 12/17/2014.  Patient report blood work 6 months ago with no mention of kidney problems. Hold ACE, follow renal ultrasound. Hold lasix.  US negative for hydronephrosis.  Renal consulted as advised by cardiology.  Cr decrease to 1.7.  Avoid hypotension.   4-A. fib with RVR: Subtherapeutic INR: Hold Cardizem due to hypotension.  Continue with warfarin with pharmacy to dose. Will defer to cardiology starting lovenox for bridge.   5-Elevated troponin: Chest pain-free, repeated troponin was trending down.  6-OSA: Continue with CPAP at at bedtime.  Bilateral bibasilar opacity on chest x-ray: outside facility.  Chest x ray in house pulmonary vascular congestion.  On IV ceftriaxone.   Cellulitis Bilateral extremities; Redness. Start doxycycline.  Acute metabolic encephalopathy; related to hypercapnia.  BIPAP      Estimated body mass index is 65.06 kg/m as calculated from the following:   Height as of this encounter: 6' (1.829 m).   Weight as of this encounter: 217.6 kg.   DVT prophylaxis: Coumadin Code Status: Full code Family Communication; care discussed with patient Disposition Plan: remain in the hospital for management of hypotension, heart failure, concern for cardiogenic shock.    Consultants:   Cardiology  Nephrology    Procedures:  Echo ;1. The left ventricle has normal systolic function, with an ejection fraction of 55-60%. The cavity size was normal. Left ventricular  diastolic Doppler parameters are indeterminate.  2. RV not seen well enough to evaluate function. Poor acoustic windows limit study.  3. The tricuspid valve is grossly normal.  4. The aortic valve is abnormal. Mild thickening of the aortic valve.  Mild calcification of the aortic valve.  5. The aortic root and ascending aorta are normal in size and structure.  6. The inferior vena cava was dilated in size with <50% respiratory variability.  7. The left ventricular function has unchanged.  8. The interatrial septum was not well visualized.     Antimicrobials:   Ceftriaxone  azithromycin   Subjective: Events from last night notice, patient was wake up from sleep and was mumble and incoherent. Subsequently he improved. Ct head negative.   This morning he wake up, was sleepy, he answer question but was confuse to place, and situation.    Objective: Vitals:   01/21/19 2038 01/21/19 2330 01/22/19 0033 01/22/19 0631  BP: 105/65  107/69 121/73  Pulse: (!) 105 (!) 101 (!) 103 99  Resp:  18 20 (!) 22  Temp:   98.7 F (37.1 C) 98.9 F (37.2 C)  TempSrc:      SpO2: 95% 95% 92% 96%  Weight:    (!) 217.6 kg  Height:        Intake/Output Summary (Last 24 hours) at 01/22/2019 0820 Last data filed at 01/22/2019 0236 Gross per 24 hour  Intake 848.63 ml  Output 650 ml  Net 198.63 ml   Filed Weights   01/21/19 0418 01/22/19 0631  Weight: (!) 221.2 kg (!) 217.6 kg    Examination:  General exam: Morbid obesity. Sleepy.  Respiratory system: Decrease breath sound, no wheezing.  Cardiovascular system:  S1, S 2 RRR  Gastrointestinal system: BS present, soft, nt  Central nervous system: sleepy, confuse moves all 4 extremities, 5/5 strengtn. . Extremities: plus 2 edema, bilateral redness.  Skin: redness LE Data Reviewed: I have personally reviewed following labs and imaging studies  CBC: Recent Labs  Lab 01/21/19 0811  WBC 7.2  HGB 14.1  HCT 45.0  MCV 102.5*  PLT 158   Basic Metabolic Panel: Recent Labs  Lab 01/21/19 0510 01/22/19 0518  NA 138 141  K 4.9 5.0  CL 93* 100  CO2 33* 33*  GLUCOSE 101* 117*  BUN 42* 41*  CREATININE 2.35* 1.72*  CALCIUM 9.1 8.7*   GFR: Estimated Creatinine Clearance: 91.7 mL/min  (A) (by C-G formula based on SCr of 1.72 mg/dL (H)). Liver Function Tests: No results for input(s): AST, ALT, ALKPHOS, BILITOT, PROT, ALBUMIN in the last 168 hours. No results for input(s): LIPASE, AMYLASE in the last 168 hours. No results for input(s): AMMONIA in the last 168 hours. Coagulation Profile: Recent Labs  Lab 01/21/19 1852 01/22/19 0518  INR 1.6* 1.4*   Cardiac Enzymes: No results for input(s): CKTOTAL, CKMB, CKMBINDEX, TROPONINI in the last 168 hours. BNP (last 3 results) No results for input(s): PROBNP in the last 8760 hours. HbA1C: No results for input(s): HGBA1C in the last 72 hours. CBG: Recent Labs  Lab 01/21/19 2034  GLUCAP 131*   Lipid Profile: No results for input(s): CHOL, HDL, LDLCALC, TRIG, CHOLHDL, LDLDIRECT in the last 72 hours. Thyroid Function Tests: No results for input(s): TSH, T4TOTAL, FREET4, T3FREE, THYROIDAB in the last 72 hours. Anemia Panel: No results for input(s): VITAMINB12, FOLATE, FERRITIN, TIBC, IRON, RETICCTPCT in the last 72 hours. Sepsis Labs: Recent Labs  Lab 01/21/19 0510 01/21/19 1131  PROCALCITON <0.10  --  LATICACIDVEN  --  0.9    Recent Results (from the past 240 hour(s))  SARS Coronavirus 2 (CEPHEID - Performed in [place] hospital lab), Hosp Order     Status: None   Collection Time: 01/21/19  4:58 AM  Result Value Ref Range Status   SARS Coronavirus 2 NEGATIVE NEGATIVE Final    Comment: (NOTE) If result is NEGATIVE SARS-CoV-2 target nucleic acids are NOT DETECTED. The SARS-CoV-2 RNA is generally detectable in upper and lower  respiratory specimens during the acute phase of infection. The lowest  concentration of SARS-CoV-2 viral copies this assay can detect is 250  copies / mL. A negative result does not preclude SARS-CoV-2 infection  and should not be used as the sole basis for treatment or other  patient management decisions.  A negative result may occur with  improper specimen collection / handling,  submission of specimen other  than nasopharyngeal swab, presence of viral mutation(s) within the  areas targeted by this assay, and inadequate number of viral copies  (<250 copies / mL). A negative result must be combined with clinical  observations, patient history, and epidemiological information. If result is POSITIVE SARS-CoV-2 target nucleic acids are DETECTED. The SARS-CoV-2 RNA is generally detectable in upper and lower  respiratory specimens dur ing the acute phase of infection.  Positive  results are indicative of active infection with SARS-CoV-2.  Clinical  correlation with patient history and other diagnostic information is  necessary to determine patient infection status.  Positive results do  not rule out bacterial infection or co-infection with other viruses. If result is PRESUMPTIVE POSTIVE SARS-CoV-2 nucleic acids MAY BE PRESENT.   A presumptive positive result was obtained on the submitted specimen  and confirmed on repeat testing.  While 2019 novel coronavirus  (SARS-CoV-2) nucleic acids may be present in the submitted sample  additional confirmatory testing may be necessary for epidemiological  and / or clinical management purposes  to differentiate between  SARS-CoV-2 and other Sarbecovirus currently known to infect humans.  If clinically indicated additional testing with an alternate test  methodology [phone]) is advised. The SARS-CoV-2 RNA is generally  detectable in upper and lower respiratory sp ecimens during the acute  phase of infection. The expected result is Negative. Fact Sheet for Patients:  StrictlyIdeas.no Fact Sheet for Healthcare Providers: BankingDealers.co.za This test is not yet approved or cleared by the [place] FDA and has been authorized for detection and/or diagnosis of SARS-CoV-2 by FDA under an Emergency Use Authorization (EUA).  This EUA will remain in effect (meaning this test can be  used) for the duration of the COVID-19 declaration under Section 564(b)(1) of the Act, 21 U.S.C. section 360bbb-3(b)(1), unless the authorization is terminated or revoked sooner. Performed at [facility], [place] [address] [place], [place] [address]   Culture, blood (routine x 2)     Status: None (Preliminary result)   Collection Time: 01/21/19 11:24 AM  Result Value Ref Range Status   Specimen Description BLOOD LEFT ANTECUBITAL  Final   Special Requests   Final    BOTTLES DRAWN AEROBIC ONLY Blood Culture adequate volume   Culture   Final    NO GROWTH < 24 HOURS Performed at [facility], [place] [address] [place], [place] [address]    Report Status PENDING  Incomplete  Culture, blood (routine x 2)     Status: None (Preliminary result)   Collection Time: 01/21/19 11:30 AM  Result Value Ref Range Status  Specimen Description BLOOD LEFT ANTECUBITAL  Final   Special Requests   Final    BOTTLES DRAWN AEROBIC ONLY Blood Culture adequate volume   Culture   Final    NO GROWTH < 24 HOURS Performed at [facility], [address] [place], [place] [id number]    Report Status PENDING  Incomplete         Radiology Studies: Ct Head Wo Contrast  Result Date: 01/21/2019 CLINICAL DATA:  Unexplained altered level of consciousness. Morbid obesity. EXAM: CT HEAD WITHOUT CONTRAST TECHNIQUE: Contiguous axial images were obtained from the base of the skull through the vertex without intravenous contrast. COMPARISON:  None. FINDINGS: Brain: No evidence of acute infarction, hemorrhage, hydrocephalus, extra-axial collection or mass lesion/mass effect. Normal for age cerebral volume. No white matter disease. Vascular: Calcification of the cavernous internal carotid arteries consistent with cerebrovascular atherosclerotic disease. No signs of intracranial large vessel occlusion. Skull: Normal. Negative for fracture or focal lesion. Sinuses/Orbits: No acute finding. Retention cyst LEFT  maxillary sinus. Unremarkable orbits. Other: None. IMPRESSION: Negative exam. Electronically Signed   By: Elsie StainJohn T Curnes M.D.   On: 01/21/2019 22:33   [place] Renal  Result Date: 01/21/2019 CLINICAL DATA:  Acute kidney injury EXAM: RENAL ULTRASOUND COMPARISON:  None. FINDINGS: Right Kidney: Renal measurements: 10.1 x 5.7 x 6.4 cm = volume: 188.7 mL . Echogenicity is within normal limits. Renal cortical thickness is low normal. Within normal limits. No mass, perinephric fluid, or hydronephrosis visualized. No sonographically demonstrable calculus or ureterectasis. Left Kidney: Renal measurements: 12.2 x 5.8 x 6.3 cm = volume: 232.1 mL. Echogenicity and renal cortical thickness are within normal limits. No mass, perinephric fluid, or hydronephrosis visualized. No sonographically demonstrable calculus or ureterectasis. Bladder: Empty and could not be assessed. IMPRESSION: No obstructing focus evident in either kidney. Renal echogenicity normal bilaterally. Renal cortical thickness is within normal limits, although renal cortical thickness on the right is at the lower limits of normal. Electronically Signed   By: Bretta BangWilliam  Woodruff III M.D.   On: 01/21/2019 21:56   Dg Chest Port 1 View  Result Date: 01/21/2019 CLINICAL DATA:  Hypotension. EXAM: PORTABLE CHEST 1 VIEW COMPARISON:  06/08/2015 and 10/23/2014 FINDINGS: Chronic cardiomegaly. Slight pulmonary vascular prominence. No discrete infiltrates or effusions. No acute bone abnormality. IMPRESSION: Mild cardiomegaly with slight pulmonary vascular congestion. Electronically Signed   By: Francene BoyersJames  Maxwell M.D.   On: 01/21/2019 12:08        Scheduled Meds: . aspirin EC  81 mg Oral Daily  . sodium chloride flush  3 mL Intravenous Q12H  . Warfarin - Pharmacist Dosing Inpatient   Does not apply q1800   Continuous Infusions: . sodium chloride 250 mL (01/21/19 1108)  . cefTRIAXone (ROCEPHIN)  IV 1 g (01/21/19 1336)  . doxycycline (VIBRAMYCIN) IV       LOS: 1 day     Time spent: 35 minutes.     Alba CoryBelkys A Rustyn Conery, MD [facility] Pager [phone]  If 7PM-7AM, please contact night-coverage [email or web address] Password [email or web address] 01/22/2019, 8:20 AM

## 2019-01-22 NOTE — Progress Notes (Signed)
RT spoke with Dr. Niel Hummer who spoke with pulmonology about the patient's ABG values. RT decreased FIO2 to 30% but no other changes ordered from MD.

## 2019-01-23 DIAGNOSIS — I428 Other cardiomyopathies: Secondary | ICD-10-CM

## 2019-01-23 LAB — CBC
HCT: 43.7 % (ref 39.0–52.0)
Hemoglobin: 13.1 g/dL (ref 13.0–17.0)
MCH: 32.1 pg (ref 26.0–34.0)
MCHC: 30 g/dL (ref 30.0–36.0)
MCV: 107.1 fL — ABNORMAL HIGH (ref 80.0–100.0)
Platelets: 136 10*3/uL — ABNORMAL LOW (ref 150–400)
RBC: 4.08 MIL/uL — ABNORMAL LOW (ref 4.22–5.81)
RDW: 14.4 % (ref 11.5–15.5)
WBC: 5.3 10*3/uL (ref 4.0–10.5)
nRBC: 0.6 % — ABNORMAL HIGH (ref 0.0–0.2)

## 2019-01-23 LAB — HEPARIN LEVEL (UNFRACTIONATED)
Heparin Unfractionated: 0.35 IU/mL (ref 0.30–0.70)
Heparin Unfractionated: 0.18 IU/mL — ABNORMAL LOW (ref 0.30–0.70)

## 2019-01-23 LAB — PROTIME-INR
INR: 1.5 — ABNORMAL HIGH (ref 0.8–1.2)
Prothrombin Time: 17.9 seconds — ABNORMAL HIGH (ref 11.4–15.2)

## 2019-01-23 LAB — BASIC METABOLIC PANEL
Anion gap: 6 (ref 5–15)
BUN: 29 mg/dL — ABNORMAL HIGH (ref 6–20)
CO2: 38 mmol/L — ABNORMAL HIGH (ref 22–32)
Calcium: 8.7 mg/dL — ABNORMAL LOW (ref 8.9–10.3)
Chloride: 96 mmol/L — ABNORMAL LOW (ref 98–111)
Creatinine, Ser: 1.16 mg/dL (ref 0.61–1.24)
GFR calc Af Amer: >60 mL/min (ref >60)
GFR calc non Af Amer: >60 mL/min (ref >60)
Glucose, Bld: 93 mg/dL (ref 70–99)
Potassium: 4.8 mmol/L (ref 3.5–5.1)
Sodium: 140 mmol/L (ref 135–145)

## 2019-01-23 MED ORDER — WARFARIN SODIUM 5 MG PO TABS
9.0000 mg | ORAL_TABLET | Freq: Once | ORAL | Status: AC
  Administered 2019-01-23: 9 mg via ORAL
  Filled 2019-01-23: qty 1

## 2019-01-23 NOTE — Plan of Care (Signed)

## 2019-01-23 NOTE — Progress Notes (Signed)
Subjective:  VSS- BP is higher - making good urine- negative 1900- off bipap and on room air.  Kidney function normal this AM  Objective Vital signs in last 24 hours: Vitals:   01/22/19 1455 01/22/19 1535 01/22/19 2037 01/23/19 0436  BP: 128/80  (!) 127/54 (!) 148/84  Pulse: 98 (!) 102 85 (!) 115  Resp: 18  16 20   Temp: 98 F (36.7 C)  98.2 F (36.8 C) 98.3 F (36.8 C)  TempSrc: Axillary  Oral Oral  SpO2: 100% 96% 95% 98%  Weight:      Height:       Weight change:   Intake/Output Summary (Last 24 hours) at 01/23/2019 0848 Last data filed at 01/23/2019 0700 Gross per 24 hour  Intake 1335.3 ml  Output 3275 ml  Net -1939.7 ml    Assessment/Plan: 58 year old WM with massive obesity and cardiomyopathy with likely chronic resp failure due to habitus- now presents with hypoxia, volume overload and AKI 1.Renal- Appears to be AKI.  Trended much better overnight probably due to better hemodynamics off of BP meds.  Non oliguric and renal function essentially normal this AM  2. Hypertension/volume  - volume overload leading to hypoxia and also hypercarbia- will choose dose of lasix 80 iv q 12.  Doing great on this - would then change to PO - at least 80 BID and he will need to be compliant and check daily weights and follow up with his PCP to maintain good volume status.  Renal function should not be a barrier to diuresis but need to limit other agents that drop BP like coreg/cardizema dn aldactone 3. UTI- should be covered by abx already on  4. Anemia  - not an issue  5. Afib-  steady   Due to high consult volume renal will sign off, see recs above and call with questions.  He will not need renal specific follow up    Cecille Aver    Labs: Basic Metabolic Panel: Recent Labs  Lab 01/21/19 0510 01/22/19 0518 01/23/19 0827  NA 138 141 140  K 4.9 5.0 4.8  CL 93* 100 96*  CO2 33* 33* 38*  GLUCOSE 101* 117* 93  BUN 42* 41* 29*  CREATININE 2.35* 1.72* 1.16  CALCIUM 9.1 8.7*  8.7*   Liver Function Tests: No results for input(s): AST, ALT, ALKPHOS, BILITOT, PROT, ALBUMIN in the last 168 hours. No results for input(s): LIPASE, AMYLASE in the last 168 hours. No results for input(s): AMMONIA in the last 168 hours. CBC: Recent Labs  Lab 01/21/19 0811 01/23/19 0827  WBC 7.2 5.3  HGB 14.1 13.1  HCT 45.0 43.7  MCV 102.5* 107.1*  PLT 158 136*   Cardiac Enzymes: No results for input(s): CKTOTAL, CKMB, CKMBINDEX, TROPONINI in the last 168 hours. CBG: Recent Labs  Lab 01/21/19 2034  GLUCAP 131*    Iron Studies: No results for input(s): IRON, TIBC, TRANSFERRIN, FERRITIN in the last 72 hours. Studies/Results: Ct Head Wo Contrast  Result Date: 01/21/2019 CLINICAL DATA:  Unexplained altered level of consciousness. Morbid obesity. EXAM: CT HEAD WITHOUT CONTRAST TECHNIQUE: Contiguous axial images were obtained from the base of the skull through the vertex without intravenous contrast. COMPARISON:  None. FINDINGS: Brain: No evidence of acute infarction, hemorrhage, hydrocephalus, extra-axial collection or mass lesion/mass effect. Normal for age cerebral volume. No white matter disease. Vascular: Calcification of the cavernous internal carotid arteries consistent with cerebrovascular atherosclerotic disease. No signs of intracranial large vessel occlusion. Skull: Normal.  Negative for fracture or focal lesion. Sinuses/Orbits: No acute finding. Retention cyst LEFT maxillary sinus. Unremarkable orbits. Other: None. IMPRESSION: Negative exam. Electronically Signed   By: Staci Righter M.D.   On: 01/21/2019 22:33   US Renal  Result Date: 01/21/2019 CLINICAL DATA:  Acute kidney injury EXAM: RENAL ULTRASOUND COMPARISON:  None. FINDINGS: Right Kidney: Renal measurements: 10.1 x 5.7 x 6.4 cm = volume: 188.7 mL . Echogenicity is within normal limits. Renal cortical thickness is low normal. Within normal limits. No mass, perinephric fluid, or hydronephrosis visualized. No sonographically  demonstrable calculus or ureterectasis. Left Kidney: Renal measurements: 12.2 x 5.8 x 6.3 cm = volume: 232.1 mL. Echogenicity and renal cortical thickness are within normal limits. No mass, perinephric fluid, or hydronephrosis visualized. No sonographically demonstrable calculus or ureterectasis. Bladder: Empty and could not be assessed. IMPRESSION: No obstructing focus evident in either kidney. Renal echogenicity normal bilaterally. Renal cortical thickness is within normal limits, although renal cortical thickness on the right is at the lower limits of normal. Electronically Signed   By: Lowella Grip III M.D.   On: 01/21/2019 21:56   Dg Chest Port 1 View  Result Date: 01/21/2019 CLINICAL DATA:  Hypotension. EXAM: PORTABLE CHEST 1 VIEW COMPARISON:  06/08/2015 and 10/23/2014 FINDINGS: Chronic cardiomegaly. Slight pulmonary vascular prominence. No discrete infiltrates or effusions. No acute bone abnormality. IMPRESSION: Mild cardiomegaly with slight pulmonary vascular congestion. Electronically Signed   By: Lorriane Shire M.D.   On: 01/21/2019 12:08   Medications: Infusions: . sodium chloride 250 mL (01/21/19 1108)  . cefTRIAXone (ROCEPHIN)  IV Stopped (01/22/19 0850)  . doxycycline (VIBRAMYCIN) IV 100 mg (01/22/19 2231)  . heparin 2,100 Units/hr (01/22/19 2307)    Scheduled Medications: . aspirin EC  81 mg Oral Daily  . furosemide  80 mg Intravenous Q12H  . sodium chloride flush  3 mL Intravenous Q12H  . Warfarin - Pharmacist Dosing Inpatient   Does not apply q1800    have reviewed scheduled and prn medications.  Physical Exam: General: NAD- obese Heart: tachy, irreg Lungs: poor effort, dec BS at bases Abdomen: obese, soft Extremities: pitting edema   01/23/2019,8:48 AM  LOS: 2 days

## 2019-01-23 NOTE — Progress Notes (Signed)
ANTICOAGULATION CONSULT NOTE  Pharmacy Consult for Heparin + Warfarin Indication: atrial fibrillation   No Known Allergies  Patient Measurements: Height: 6' (182.9 cm) Weight: (!) 479 lb 11.2 oz (217.6 kg) IBW/kg (Calculated) : 77.6 Heparin Dosing Weight: 134.3 kg  Vital Signs: Temp: 98.3 F (36.8 C) (06/07 0436) Temp Source: Oral (06/07 0436) BP: 148/84 (06/07 0436) Pulse Rate: 111 (06/07 1032)  Labs: Recent Labs    01/21/19 0510 01/21/19 7824 01/21/19 1852 01/22/19 0518 01/22/19 2159 01/23/19 0827 01/23/19 1100  HGB  --  14.1  --   --   --  13.1  --   HCT  --  45.0  --   --   --  43.7  --   PLT  --  158  --   --   --  136*  --   LABPROT  --   --  18.7* 17.4*  --  17.9*  --   INR  --   --  1.6* 1.4*  --  1.5*  --   HEPARINUNFRC  --   --   --   --  0.25*  --  0.35  CREATININE 2.35*  --   --  1.72*  --  1.16  --    Estimated Creatinine Clearance: 136 mL/min (by C-G formula based on SCr of 1.16 mg/dL).  Assessment: 34 yoM admitted for CHF exacerbation and AKI. PMH afib on warfarin PTA. Pharmacy already consulted to continue dosing warfarin, now consulted to start IV heparin bridge as INR below goal.  Heparin level today, drawn late, is therapeutic at 0.35. INR remains below goal at 1.5. CBC from this morning is stable is no bleeding or infusion issues noted.   Goal of Therapy:  INR 2-3 Heparin level 0.3-0.7 units/ml Monitor platelets by anticoagulation protocol: Yes   Plan:  Continue heparin at 2100 units/hr Warfarin 9 mg PO x1 tonight Check confirmatory heparin level in 6 hours Daily heparin level, INR, and CBC Monitor s/sx of bleeding  Jackson Latino, PharmD PGY1 Pharmacy Resident Phone [phone] 01/23/2019     12:17 PM

## 2019-01-23 NOTE — Progress Notes (Signed)
Patient is currently on BiPAP QHS at this time tolerating it well. No distress or complications noted.

## 2019-01-23 NOTE — Progress Notes (Signed)
ANTICOAGULATION CONSULT NOTE  Pharmacy Consult for Heparin + Warfarin Indication: atrial fibrillation   No Known Allergies  Patient Measurements: Height: 6' (182.9 cm) Weight: (!) 479 lb 11.2 oz (217.6 kg) IBW/kg (Calculated) : 77.6 Heparin Dosing Weight: 134.3 kg  Vital Signs: Temp: 98.8 F (37.1 C) (06/07 1543) Temp Source: Oral (06/07 1543) BP: 122/95 (06/07 1543) Pulse Rate: 103 (06/07 1543)  Labs: Recent Labs    01/21/19 0510 01/21/19 3007 01/21/19 1852 01/22/19 0518 01/22/19 2159 01/23/19 0827 01/23/19 1100 01/23/19 1732  HGB  --  14.1  --   --   --  13.1  --   --   HCT  --  45.0  --   --   --  43.7  --   --   PLT  --  158  --   --   --  136*  --   --   LABPROT  --   --  18.7* 17.4*  --  17.9*  --   --   INR  --   --  1.6* 1.4*  --  1.5*  --   --   HEPARINUNFRC  --   --   --   --  0.25*  --  0.35 0.18*  CREATININE 2.35*  --   --  1.72*  --  1.16  --   --    Estimated Creatinine Clearance: 136 mL/min (by C-G formula based on SCr of 1.16 mg/dL).  Assessment: 24 yoM admitted for CHF exacerbation and AKI. PMH afib on warfarin PTA. Pharmacy already consulted to continue dosing warfarin, now consulted to start IV heparin bridge as INR below goal.  Heparin level now below goal range at 0.18. No issues with infusion, running at correct rate confirmed with RN. Briefly lost IV access but no more than a few minutes off of heparin. CBC stable, no bleeding noted.   Goal of Therapy:  INR 2-3 Heparin level 0.3-0.7 units/ml Monitor platelets by anticoagulation protocol: Yes   Plan:  Continue heparin at 2400 units/hr Recheck heparin level in 6 hours Daily heparin level, INR, and CBC Monitor s/sx of bleeding  Erin N. Gerarda Fraction, PharmD, [place] PGY2 Infectious Diseases Pharmacy Resident Phone: [phone] 01/23/2019     6:16 PM

## 2019-01-23 NOTE — Progress Notes (Signed)
PROGRESS NOTE    Travis Bautista  QTM:[id number] DOB: [birth year] DOA: 01/21/2019 PCP: Chilton Greathouse, MD    Brief Narrative: 58 year old with past medical history significant for obesity BMI more than 60, nonischemic cardiomyopathy, A. fib on Coumadin now presents complaining of shortness of breath.  Patient was noted to be hypoxic with oxygen sat measuring at home in the 70%.  He also stopped taking his evening basis.  He has not been compliant with INR monitoring.  At [facility] ED: Patient was found to be afebrile oxygen saturation in the low 80s on room air, tachycardic.  Chest x-ray showed cardiomegaly with mild bibasilar opacity.  Cr 2.3, previously normal.  proBNP 3351.  ABG pH 7.3 PCO2 68 PO2 79.  Assessment & Plan:   Principal Problem:   Acute on chronic respiratory failure with hypoxia and hypercapnia (HCC) Active Problems:   NICM (nonischemic cardiomyopathy) (HCC)   OSA (obstructive sleep apnea)   HTN (hypertension)   AKI (acute kidney injury) (HCC)   Renal insufficiency   Acute on chronic systolic CHF (congestive heart failure) (HCC)   Atrial fibrillation, permanent   Subtherapeutic international normalized ratio (INR)   Elevated troponin   Acute respiratory failure with hypoxia and hypercapnia (HCC)   Hypotension   1-Acute hypoxic and hypercarbic respiratory failure, nonischemic cardiomyopathy: Acute diastolic Heart failure exacerbation.  Patient presented with shortness of breath hypoxia and he is stop his evening Lasix dose. Prior history of nonischemic cardiomyopathy with ejection fraction 20 to 25% that has improved to 55% per notes in 2016. -ECHO EF 55%  -cardiology consulted for evaluation of possible cardiogenic shock.  -Patient was confuse the morning of 6-05;  ABG showed PH 7.2, PCO 84. Placed on BIPAP transfer to step down unit.  Off BIPAP this am, alert and oriented and appropriate. Patient to  use BIPAPA at [facility] and if taking naps.  On IV lasix.  Negative 1 L.   2-Hypotension;  Asymptomatic.  Hold Cardizem, Coreg which he has not received.  Concern for  cardiogenic shock. Cardiology consulted.  Received IV bolus. Normal lactic acid.  Started  doxy to cover for LE cellulitis.  Improved.   3-AKI: Creatinine on admission at 2.3, most recent prior labs on 12/17/2014.  Patient report blood work 6 months ago with no mention of kidney problems. Hold ACE. [place] negative for hydronephrosis.  Renal consulted as advised by cardiology.  Cr trending down on IV lasix.  Improved.  Avoid hypotension.   4-A. fib with RVR: Subtherapeutic INR: Hold Cardizem due to hypotension.  Continue with warfarin with pharmacy to dose. On heparin Gtt Defer to cardiology  resumption of BB, vs cardizem.   5-Elevated troponin: Chest pain-free, repeated troponin was trending down.  6-OSA: Continue with CPAP at at bedtime.  Bilateral bibasilar opacity on chest x-ray: outside facility.  Chest x ray in house pulmonary vascular congestion.  On IV ceftriaxone.   Cellulitis Bilateral extremities; Redness. Started  doxycycline.  Acute metabolic encephalopathy; related to hypercapnia.  BIPAP  Improved.      Estimated body mass index is 65.06 kg/m as calculated from the following:   Height as of this encounter: 6' (1.829 m).   Weight as of this encounter: 217.6 kg.   DVT prophylaxis: Coumadin Code Status: Full code Family Communication; care discussed with patient Disposition Plan: remain in the hospital for management of hypotension, heart failure.    Consultants:   Cardiology  Nephrology    Procedures:  Echo ;1. The left ventricle has  normal systolic function, with an ejection fraction of 55-60%. The cavity size was normal. Left ventricular diastolic Doppler parameters are indeterminate.  2. RV not seen well enough to evaluate function. Poor acoustic windows limit study.  3. The tricuspid valve is grossly normal.  4. The aortic valve is  abnormal. Mild thickening of the aortic valve. Mild calcification of the aortic valve.  5. The aortic root and ascending aorta are normal in size and structure.  6. The inferior vena cava was dilated in size with <50% respiratory variability.  7. The left ventricular function has unchanged.  8. The interatrial septum was not well visualized.     Antimicrobials:   Ceftriaxone  azithromycin   Subjective: He is off BIPAP. Alert and oriented times 3, he was able to tell me why he is in the hosippital. He is breathing better. Not back at baseline. Still on 4 L oxygen this am.   Objective: Vitals:   01/22/19 1455 01/22/19 1535 01/22/19 2037 01/23/19 0436  BP: 128/80  (!) 127/54 (!) 148/84  Pulse: 98 (!) 102 85 (!) 115  Resp: 18  16 20   Temp: 98 F (36.7 C)  98.2 F (36.8 C) 98.3 F (36.8 C)  TempSrc: Axillary  Oral Oral  SpO2: 100% 96% 95% 98%  Weight:      Height:        Intake/Output Summary (Last 24 hours) at 01/23/2019 0850 Last data filed at 01/23/2019 0700 Gross per 24 hour  Intake 1335.3 ml  Output 3275 ml  Net -1939.7 ml   Filed Weights   01/21/19 0418 01/22/19 0631  Weight: (!) 221.2 kg (!) 217.6 kg    Examination:  General exam: Morbid obesity, alert.  Respiratory system: no wheezing, decreased breath sounds.  Cardiovascular system:  S 1, S 2 IRR  Gastrointestinal system:BS present, soft, nt Central nervous system: alert, following command.  Extremities: plus 2 edema, unna boot.  Skin: redness LE Data Reviewed: I have personally reviewed following labs and imaging studies  CBC: Recent Labs  Lab 01/21/19 0811  WBC 7.2  HGB 14.1  HCT 45.0  MCV 102.5*  PLT 158   Basic Metabolic Panel: Recent Labs  Lab 01/21/19 0510 01/22/19 0518  NA 138 141  K 4.9 5.0  CL 93* 100  CO2 33* 33*  GLUCOSE 101* 117*  BUN 42* 41*  CREATININE 2.35* 1.72*  CALCIUM 9.1 8.7*   GFR: Estimated Creatinine Clearance: 91.7 mL/min (A) (by C-G formula based on SCr of  1.72 mg/dL (H)). Liver Function Tests: No results for input(s): AST, ALT, ALKPHOS, BILITOT, PROT, ALBUMIN in the last 168 hours. No results for input(s): LIPASE, AMYLASE in the last 168 hours. No results for input(s): AMMONIA in the last 168 hours. Coagulation Profile: Recent Labs  Lab 01/21/19 1852 01/22/19 0518  INR 1.6* 1.4*   Cardiac Enzymes: No results for input(s): CKTOTAL, CKMB, CKMBINDEX, TROPONINI in the last 168 hours. BNP (last 3 results) No results for input(s): PROBNP in the last 8760 hours. HbA1C: No results for input(s): HGBA1C in the last 72 hours. CBG: Recent Labs  Lab 01/21/19 2034  GLUCAP 131*   Lipid Profile: No results for input(s): CHOL, HDL, LDLCALC, TRIG, CHOLHDL, LDLDIRECT in the last 72 hours. Thyroid Function Tests: No results for input(s): TSH, T4TOTAL, FREET4, T3FREE, THYROIDAB in the last 72 hours. Anemia Panel: No results for input(s): VITAMINB12, FOLATE, FERRITIN, TIBC, IRON, RETICCTPCT in the last 72 hours. Sepsis Labs: Recent Labs  Lab 01/21/19 0510 01/21/19  [place] <0.10  --   LATICACIDVEN  --  0.9    Recent Results (from the past 240 hour(s))  SARS Coronavirus 2 (CEPHEID - Performed in [place] hospital lab), Hosp Order     Status: None   Collection Time: 01/21/19  4:58 AM  Result Value Ref Range Status   SARS Coronavirus 2 NEGATIVE NEGATIVE Final    Comment: (NOTE) If result is NEGATIVE SARS-CoV-2 target nucleic acids are NOT DETECTED. The SARS-CoV-2 RNA is generally detectable in upper and lower  respiratory specimens during the acute phase of infection. The lowest  concentration of SARS-CoV-2 viral copies this assay can detect is 250  copies / mL. A negative result does not preclude SARS-CoV-2 infection  and should not be used as the sole basis for treatment or other  patient management decisions.  A negative result may occur with  improper specimen collection / handling, submission of specimen other  than  nasopharyngeal swab, presence of viral mutation(s) within the  areas targeted by this assay, and inadequate number of viral copies  (<250 copies / mL). A negative result must be combined with clinical  observations, patient history, and epidemiological information. If result is POSITIVE SARS-CoV-2 target nucleic acids are DETECTED. The SARS-CoV-2 RNA is generally detectable in upper and lower  respiratory specimens dur ing the acute phase of infection.  Positive  results are indicative of active infection with SARS-CoV-2.  Clinical  correlation with patient history and other diagnostic information is  necessary to determine patient infection status.  Positive results do  not rule out bacterial infection or co-infection with other viruses. If result is PRESUMPTIVE POSTIVE SARS-CoV-2 nucleic acids MAY BE PRESENT.   A presumptive positive result was obtained on the submitted specimen  and confirmed on repeat testing.  While 2019 novel coronavirus  (SARS-CoV-2) nucleic acids may be present in the submitted sample  additional confirmatory testing may be necessary for epidemiological  and / or clinical management purposes  to differentiate between  SARS-CoV-2 and other Sarbecovirus currently known to infect humans.  If clinically indicated additional testing with an alternate test  methodology [phone]) is advised. The SARS-CoV-2 RNA is generally  detectable in upper and lower respiratory sp ecimens during the acute  phase of infection. The expected result is Negative. Fact Sheet for Patients:  StrictlyIdeas.no Fact Sheet for Healthcare Providers: BankingDealers.co.za This test is not yet approved or cleared by the [place] FDA and has been authorized for detection and/or diagnosis of SARS-CoV-2 by FDA under an Emergency Use Authorization (EUA).  This EUA will remain in effect (meaning this test can be used) for the duration of the  COVID-19 declaration under Section 564(b)(1) of the Act, 21 U.S.C. section 360bbb-3(b)(1), unless the authorization is terminated or revoked sooner. Performed at [facility], [place] [address] [place], [place] [address]   Culture, blood (routine x 2)     Status: None (Preliminary result)   Collection Time: 01/21/19 11:24 AM  Result Value Ref Range Status   Specimen Description BLOOD LEFT ANTECUBITAL  Final   Special Requests   Final    BOTTLES DRAWN AEROBIC ONLY Blood Culture adequate volume   Culture   Final    NO GROWTH 2 DAYS Performed at [facility], [place] [address] [place], [place] [address]    Report Status PENDING  Incomplete  Culture, blood (routine x 2)     Status: None (Preliminary result)   Collection Time: 01/21/19 11:30 AM  Result Value Ref Range Status   Specimen Description BLOOD LEFT ANTECUBITAL  Final   Special Requests   Final    BOTTLES DRAWN AEROBIC ONLY Blood Culture adequate volume   Culture   Final    NO GROWTH 2 DAYS Performed at [facility], [address] [place], [place] [address]    Report Status PENDING  Incomplete         Radiology Studies: Ct Head Wo Contrast  Result Date: 01/21/2019 CLINICAL DATA:  Unexplained altered level of consciousness. Morbid obesity. EXAM: CT HEAD WITHOUT CONTRAST TECHNIQUE: Contiguous axial images were obtained from the base of the skull through the vertex without intravenous contrast. COMPARISON:  None. FINDINGS: Brain: No evidence of acute infarction, hemorrhage, hydrocephalus, extra-axial collection or mass lesion/mass effect. Normal for age cerebral volume. No white matter disease. Vascular: Calcification of the cavernous internal carotid arteries consistent with cerebrovascular atherosclerotic disease. No signs of intracranial large vessel occlusion. Skull: Normal. Negative for fracture or focal lesion. Sinuses/Orbits: No acute finding. Retention cyst LEFT maxillary sinus. Unremarkable orbits.  Other: None. IMPRESSION: Negative exam. Electronically Signed   By: Elsie StainJohn T Curnes M.D.   On: 01/21/2019 22:33   [place] Renal  Result Date: 01/21/2019 CLINICAL DATA:  Acute kidney injury EXAM: RENAL ULTRASOUND COMPARISON:  None. FINDINGS: Right Kidney: Renal measurements: 10.1 x 5.7 x 6.4 cm = volume: 188.7 mL . Echogenicity is within normal limits. Renal cortical thickness is low normal. Within normal limits. No mass, perinephric fluid, or hydronephrosis visualized. No sonographically demonstrable calculus or ureterectasis. Left Kidney: Renal measurements: 12.2 x 5.8 x 6.3 cm = volume: 232.1 mL. Echogenicity and renal cortical thickness are within normal limits. No mass, perinephric fluid, or hydronephrosis visualized. No sonographically demonstrable calculus or ureterectasis. Bladder: Empty and could not be assessed. IMPRESSION: No obstructing focus evident in either kidney. Renal echogenicity normal bilaterally. Renal cortical thickness is within normal limits, although renal cortical thickness on the right is at the lower limits of normal. Electronically Signed   By: Bretta BangWilliam  Woodruff III M.D.   On: 01/21/2019 21:56   Dg Chest Port 1 View  Result Date: 01/21/2019 CLINICAL DATA:  Hypotension. EXAM: PORTABLE CHEST 1 VIEW COMPARISON:  06/08/2015 and 10/23/2014 FINDINGS: Chronic cardiomegaly. Slight pulmonary vascular prominence. No discrete infiltrates or effusions. No acute bone abnormality. IMPRESSION: Mild cardiomegaly with slight pulmonary vascular congestion. Electronically Signed   By: Francene BoyersJames  Maxwell M.D.   On: 01/21/2019 12:08        Scheduled Meds: . aspirin EC  81 mg Oral Daily  . furosemide  80 mg Intravenous Q12H  . sodium chloride flush  3 mL Intravenous Q12H  . Warfarin - Pharmacist Dosing Inpatient   Does not apply q1800   Continuous Infusions: . sodium chloride 250 mL (01/21/19 1108)  . cefTRIAXone (ROCEPHIN)  IV Stopped (01/22/19 0850)  . doxycycline (VIBRAMYCIN) IV 100 mg (01/22/19  2231)  . heparin 2,100 Units/hr (01/22/19 2307)     LOS: 2 days    Time spent: 35 minutes.     Alba CoryBelkys A Lyndie Vanderloop, MD [facility] Pager [phone]  If 7PM-7AM, please contact night-coverage [email or web address] Password [email or web address] 01/23/2019, 8:50 AM

## 2019-01-23 NOTE — Progress Notes (Signed)
Progress Note  Patient Name: Travis Bautista Date of Encounter: 01/23/2019  Primary Cardiologist: Chrystie Nose, MD   Subjective   Currently on BiPAP but off BiPAP earlier today.    Inpatient Medications    Scheduled Meds: . aspirin EC  81 mg Oral Daily  . furosemide  80 mg Intravenous Q12H  . sodium chloride flush  3 mL Intravenous Q12H  . Warfarin - Pharmacist Dosing Inpatient   Does not apply q1800   Continuous Infusions: . sodium chloride 250 mL (01/21/19 1108)  . cefTRIAXone (ROCEPHIN)  IV 1 g (01/23/19 0942)  . doxycycline (VIBRAMYCIN) IV 100 mg (01/23/19 2154)  . heparin 2,400 Units/hr (01/23/19 1825)   PRN Meds: sodium chloride, acetaminophen, ondansetron (ZOFRAN) IV, sodium chloride flush, traZODone   Vital Signs    Vitals:   01/23/19 0436 01/23/19 1032 01/23/19 1543 01/23/19 2147  BP: (!) 148/84  (!) 122/95 114/70  Pulse: (!) 115 (!) 111 (!) 103 (!) 105  Resp: 20   20  Temp: 98.3 F (36.8 C)  98.8 F (37.1 C) 98.7 F (37.1 C)  TempSrc: Oral  Oral Oral  SpO2: 98% 100% 92%   Weight:      Height:        Intake/Output Summary (Last 24 hours) at 01/23/2019 2227 Last data filed at 01/23/2019 1900 Gross per 24 hour  Intake 2337.81 ml  Output 3925 ml  Net -1587.19 ml   Filed Weights   01/21/19 0418 01/22/19 0631  Weight: (!) 221.2 kg (!) 217.6 kg    Telemetry    NSR - Personally Reviewed  ECG    No new EKG to review - Personally Reviewed  Physical Exam   Unable to exam at this time  Labs    Chemistry Recent Labs  Lab 01/21/19 0510 01/22/19 0518 01/23/19 0827  NA 138 141 140  K 4.9 5.0 4.8  CL 93* 100 96*  CO2 33* 33* 38*  GLUCOSE 101* 117* 93  BUN 42* 41* 29*  CREATININE 2.35* 1.72* 1.16  CALCIUM 9.1 8.7* 8.7*  GFRNONAA 30* 44* >60  GFRAA 35* 51* >60  ANIONGAP 12 8 6      Hematology Recent Labs  Lab 01/21/19 0811 01/23/19 0827  WBC 7.2 5.3  RBC 4.39 4.08*  HGB 14.1 13.1  HCT 45.0 43.7  MCV 102.5* 107.1*  MCH 32.1 32.1   MCHC 31.3 30.0  RDW 14.6 14.4  PLT 158 136*    Cardiac EnzymesNo results for input(s): TROPONINI in the last 168 hours. No results for input(s): TROPIPOC in the last 168 hours.   BNPNo results for input(s): BNP, PROBNP in the last 168 hours.   DDimer No results for input(s): DDIMER in the last 168 hours.   Radiology    No results found.  Cardiac Studies   2D echo IMPRESSIONS    1. The left ventricle has normal systolic function, with an ejection fraction of 55-60%. The cavity size was normal. Left ventricular diastolic Doppler parameters are indeterminate.  2. RV not seen well enough to evaluate function. Poor acoustic windows limit study.  3. The tricuspid valve is grossly normal.  4. The aortic valve is abnormal. Mild thickening of the aortic valve. Mild calcification of the aortic valve.  5. The aortic root and ascending aorta are normal in size and structure.  6. The inferior vena cava was dilated in size with <50% respiratory variability.  7. The left ventricular function has unchanged.  8. The interatrial septum was  not well visualized.  Patient Profile     58 y.o. male male with a hx of morbid obesity with a BMI >60, nonischemic cardiomyopathy now with improved LV function per echocardiogram, permanent atrial fibrillation on chronic Coumadin therapy, hypertension and gout who is being seen today for the evaluation of CHF exacerbation at the request of Dr. Sunnie Nielsenegalado.  Assessment & Plan    1.  Acute respiratory failure secondary to acute on chronic heart failure versus CAP -[facility] progressively worsening hypoxia and shortness of breath after stopping his evening dose of Lasix 2 weeks prior to presentation secondary to frequent nocturnal urination -BNP on initial lab work was markedly elevated at 3351 with CXR revealing cardiomegaly and mild bibasilar opacities -He has a prior history of nonischemic cardiomyopathy from 2009 with an initial LVEF of  20-25% thought to be secondary to atrial fibrillation. He had a follow up echocardiogram in 2015 that showed grossly normal LV function.  -Repeat echo from this admission with normal LV function EF 55 to 60% -IV Lasix on hold Friday due to hypotension  -Also with AKI on presentation with a creatinine of 2.35 -Creatinine improved today from 2.35-1.72. -Home meds: carvedilol 12.5, Lasix 80 in the AM 40 in the PM, ramipril 5 spironolactone 25 all on hold due to hypotension and worsening renal function Friday -He put out 2.6L yesterday and is net neg 2.6L since yesterday. -creatinine significantly improved to 1.16 today.  -per renal - restarting diuretics today  2.  Persistent atrial fibrillation on Coumadin therapy: -Patient has known persistent/chronic atrial fibrillation datuing back many years -He has failed DCCV in the past and remains fairly asymptomatic with rate controlled AF -Initially controlled on digoxin and amiodarone however these have since been discontinued many years ago -BP is improved today but HR is now trending upward -will restart Carvedilol 3.125mg  BID tomorrow and titrate as needed for HR control -On chronic coumadin therapy and has INR followed with [facility] -INR on presentation subtherapeutic at 1.2.  INR today 1.5 -Coumadin dosed per pharmacy for CHA2DS2VASc =2 (CHF and hypertension) -continue on IV Heparin gtt until INR therapeutic - per pharmacy  3.  History of nonischemic cardiomyopathy with improved LV function: -Cardiac catheterization from 2009 with non-ischemic cardiomyopathy in the setting of uncontrolled atrial fibrillation with an EF of 20-25%  -Repeat echocardiogram from 2015 with improved LV function to 55% -Fluid volume maintained on high dose diuretics with PO Lasix 80mg  in AM and 40mg  in PM at home -Creatinine found to be elevated on presentation at 2.35 -Baseline appears to be in the 1.1-1.2 range however last results from 2016  -diuretics on hold but renal saw today and recommends restartin Lasix 80mg  IV BID and then change to 80mg  BID PO.  4.  Hypertension: -Was hypotensive Friday 100/53>78/50>77/43>86/53>102/59 -Antihypertensives on hold and BP improved today -restart low dose carvedilol in am  5. AKI: -Creatinine, 2.35 on presentation with a baseline in the 1.1-1.2 range from 2016. -ACEI and Lasix on hold  -Creatinine improved today due to better hemodynamics -nephrology following  6. OSA: -Reports that he is a poor sleeper however does report compliance with CPAP   7. Bilateral bibasilar opacities on CXR: -Treated with empiric IV antibiotics -No leukocytosis or fever  8.  Chronic lower extremity edema: -Has chronic lower extremity edema in the setting of venous stasis secondary to morbid obesity -Continue with bilateral compression stockings/wraps -Reports good fluid volume management with Lasix 80 mg in a.m., 40  mg in p.m at home -Currently diuretics on hold due to soft BP and worsening renal function yesterday. -Renal recommends restart of diuretics     For questions or updates, please contact [place] Please consult [email or web address] for contact info under Cardiology/STEMI.      Signed, Fransico Him, MD  01/23/2019, 10:27 PM

## 2019-01-24 DIAGNOSIS — N179 Acute kidney failure, unspecified: Secondary | ICD-10-CM

## 2019-01-24 LAB — BASIC METABOLIC PANEL
Anion gap: 6 (ref 5–15)
BUN: 27 mg/dL — ABNORMAL HIGH (ref 6–20)
CO2: 36 mmol/L — ABNORMAL HIGH (ref 22–32)
Calcium: 8.3 mg/dL — ABNORMAL LOW (ref 8.9–10.3)
Chloride: 97 mmol/L — ABNORMAL LOW (ref 98–111)
Creatinine, Ser: 1.15 mg/dL (ref 0.61–1.24)
GFR calc Af Amer: >60 mL/min (ref >60)
GFR calc non Af Amer: >60 mL/min (ref >60)
Glucose, Bld: 103 mg/dL — ABNORMAL HIGH (ref 70–99)
Potassium: 4.5 mmol/L (ref 3.5–5.1)
Sodium: 139 mmol/L (ref 135–145)

## 2019-01-24 LAB — CBC
HCT: 39.7 % (ref 39.0–52.0)
Hemoglobin: 12 g/dL — ABNORMAL LOW (ref 13.0–17.0)
MCH: 31.6 pg (ref 26.0–34.0)
MCHC: 30.2 g/dL (ref 30.0–36.0)
MCV: 104.5 fL — ABNORMAL HIGH (ref 80.0–100.0)
Platelets: 125 10*3/uL — ABNORMAL LOW (ref 150–400)
RBC: 3.8 MIL/uL — ABNORMAL LOW (ref 4.22–5.81)
RDW: 14.4 % (ref 11.5–15.5)
WBC: 6.2 10*3/uL (ref 4.0–10.5)
nRBC: 0 % (ref 0.0–0.2)

## 2019-01-24 LAB — BLOOD GAS, ARTERIAL
Acid-Base Excess: 14.1 mmol/L — ABNORMAL HIGH (ref 0.0–2.0)
Bicarbonate: 40.9 mmol/L — ABNORMAL HIGH (ref 20.0–28.0)
Drawn by: 44166
O2 Content: 3 L/min
O2 Saturation: 86.2 %
Patient temperature: 98.6
pCO2 arterial: 85.7 mmHg (ref 32.0–48.0)
pH, Arterial: 7.3 — ABNORMAL LOW (ref 7.350–7.450)
pO2, Arterial: 54.3 mmHg — ABNORMAL LOW (ref 83.0–108.0)

## 2019-01-24 LAB — HEPARIN LEVEL (UNFRACTIONATED)
Heparin Unfractionated: 0.25 IU/mL — ABNORMAL LOW (ref 0.30–0.70)
Heparin Unfractionated: 0.69 IU/mL (ref 0.30–0.70)
Heparin Unfractionated: 0.63 IU/mL (ref 0.30–0.70)

## 2019-01-24 LAB — PROTIME-INR
INR: 1.6 — ABNORMAL HIGH (ref 0.8–1.2)
Prothrombin Time: 18.6 seconds — ABNORMAL HIGH (ref 11.4–15.2)

## 2019-01-24 MED ORDER — WARFARIN SODIUM 10 MG PO TABS
10.0000 mg | ORAL_TABLET | Freq: Once | ORAL | Status: AC
  Administered 2019-01-24: 10 mg via ORAL
  Filled 2019-01-24: qty 1

## 2019-01-24 MED ORDER — CARVEDILOL 3.125 MG PO TABS
3.1250 mg | ORAL_TABLET | Freq: Two times a day (BID) | ORAL | Status: DC
  Administered 2019-01-24 (×2): 3.125 mg via ORAL
  Filled 2019-01-24 (×2): qty 1

## 2019-01-24 MED ORDER — FUROSEMIDE 80 MG PO TABS
80.0000 mg | ORAL_TABLET | Freq: Two times a day (BID) | ORAL | Status: DC
  Administered 2019-01-24 – 2019-01-25 (×3): 80 mg via ORAL
  Filled 2019-01-24 (×3): qty 1

## 2019-01-24 NOTE — Evaluation (Signed)
Physical Therapy Evaluation Patient Details Name: Travis Bautista MRN: [id number] DOB: 1961/04/14 Today's Date: 01/24/2019   History of Present Illness  58 year old with past medical history significant for obesity BMI more than 60, nonischemic cardiomyopathy, A. fib on Coumadin now presents complaining of shortness of breath.  Patient was noted to be hypoxic with oxygen sat measuring at home in the 70%. In [place], ED Patient was found to be afebrile oxygen saturation in the low 80s on room air, tachycardic.  Chest x-ray showed cardiomegaly with mild bibasilar opacity. Transfered to [facility] 01/21/19  Clinical Impression  PTA pt independent in mobility with [facility], owns and works in music store, independent in [place]. Pt lives on main floor in loft condo with his wife and son. Pt is limited in safe mobility by oxygen desaturation (see General Comments), low back pain, and obesity that contribute to his decreased strength and endurance. Pt requires supervision for bed mobility, and min guard for transfers and ambulation of 12 feet with SPC. Pt does not require additional PT at discharge, however PT will continue to follow acutely to work on strength and endurance until d/c.    Follow Up Recommendations No PT follow up;Supervision - Intermittent    Equipment Recommendations  None recommended by PT       Precautions / Restrictions Precautions Precautions: None Restrictions Weight Bearing Restrictions: No      Mobility  Bed Mobility Overal bed mobility: Needs Assistance Bed Mobility: Supine to Sit;Sit to Supine     Supine to sit: Supervision Sit to supine: Supervision   General bed mobility comments: supervision for safety, increased time and effort required with hoB elevated  Transfers Overall transfer level: Needs assistance Equipment used: Straight cane Transfers: Sit to/from Stand Sit to Stand: Min guard         General transfer comment: min guard for safety, increased effort  and momentum utilized to stand from bed surface and straight chair  Ambulation/Gait Ambulation/Gait assistance: Min guard Gait Distance (Feet): 12 Feet(2x12) Assistive device: Straight cane Gait Pattern/deviations: Step-through pattern;Decreased step length - right;Decreased step length - left;Shuffle Gait velocity: slowed Gait velocity interpretation: <1.8 ft/sec, indicate of risk for recurrent falls General Gait Details: min guard for safety with slow, steady, waddling gait, distance limited by increased LBP       Balance Overall balance assessment: Needs assistance Sitting-balance support: Feet supported;No upper extremity supported Sitting balance-Leahy Scale: Fair     Standing balance support: Single extremity supported Standing balance-Leahy Scale: Fair Standing balance comment: able to statically stand but needs single UE support for dynamic activity                             Pertinent Vitals/Pain Pain Assessment: Faces Faces Pain Scale: Hurts whole lot Pain Location: R sided low back  Pain Descriptors / Indicators: Aching;Sore Pain Intervention(s): Limited activity within patient's tolerance;Monitored during session;Repositioned    Home Living Family/patient expects to be discharged to:: Private residence Living Arrangements: Spouse/significant other Available Help at Discharge: Family;Available 24 hours/day Type of Home: Apartment Home Access: Level entry     Home Layout: Multi-level;Able to live on main level with bedroom/bathroom Home Equipment: Gilford Rile - 2 wheels;Cane - single point;Shower seat      Prior Function Level of Independence: Independent with assistive device(s)         Comments: uses cane for ambulation, owns a music store, independent in ADLs  Extremity/Trunk Assessment   Upper Extremity Assessment Upper Extremity Assessment: Defer to OT evaluation    Lower Extremity Assessment Lower Extremity Assessment: RLE  deficits/detail;LLE deficits/detail RLE Deficits / Details: ROM limited by body habitus, strength grossly 4/5 RLE Coordination: decreased fine motor LLE Deficits / Details: ROM limited by body habitus, strength grossly 4/5 LLE Coordination: decreased fine motor       Communication   Communication: No difficulties  Cognition Arousal/Alertness: Awake/alert Behavior During Therapy: WFL for tasks assessed/performed Overall Cognitive Status: Within Functional Limits for tasks assessed                                        General Comments General comments (skin integrity, edema, etc.): on entry pt 88%SaO2 on RA, pt placed on 2L O2 via [place] for activity, SaO2 increased to 97%O2, with ambulation dropped to 93%O2, after ambulation supplemental O2 removed and pt able to maintain SaO2 94%O2        Assessment/Plan    PT Assessment Patient needs continued PT services  PT Problem List Obesity;Decreased mobility;Decreased activity tolerance;Cardiopulmonary status limiting activity       PT Treatment Interventions DME instruction;Gait training;Functional mobility training;Therapeutic activities;Therapeutic exercise;Balance training;Cognitive remediation;Patient/family education    PT Goals (Current goals can be found in the Care Plan section)  Acute Rehab PT Goals Patient Stated Goal: go home  PT Goal Formulation: With patient Time For Goal Achievement: 02/07/19 Potential to Achieve Goals: Fair    Frequency Min 3X/week    AM-PAC PT "6 Clicks" Mobility  Outcome Measure Help needed turning from your back to your side while in a flat bed without using bedrails?: A Little Help needed moving from lying on your back to sitting on the side of a flat bed without using bedrails?: A Little Help needed moving to and from a bed to a chair (including a wheelchair)?: None Help needed standing up from a chair using your arms (e.g., wheelchair or bedside chair)?: None Help needed to  walk in hospital room?: None Help needed climbing 3-5 steps with a railing? : A Lot 6 Click Score: 20    End of Session Equipment Utilized During Treatment: Oxygen Activity Tolerance: Patient limited by pain(low back pain with ambulation ) Patient left: in bed;with call bell/phone within reach Nurse Communication: Mobility status PT Visit Diagnosis: Pain;Other abnormalities of gait and mobility (R26.89);Muscle weakness (generalized) (M62.81);Difficulty in walking, not elsewhere classified (R26.2) Pain - part of body: (low back pain)    Time: 1345-1415 PT Time Calculation (min) (ACUTE ONLY): 30 min   Charges:   PT Evaluation $PT Eval Moderate Complexity: 1 Mod PT Treatments $Gait Training: 8-22 mins        Joni Colegrove B. Beverely Risen PT, DPT Acute Rehabilitation Services Pager [phone] Office [phone]   Elon Alas Fleet 01/24/2019, 2:40 PM

## 2019-01-24 NOTE — Progress Notes (Signed)
PROGRESS NOTE    Travis Bautista  DGL:[id number] DOB: 1960/10/16 DOA: 01/21/2019 PCP: Prince Solian, MD    Brief Narrative:  Patient is 58 year old man with history of morbid obesity, sleep apnea, obesity hypoventilation, nonischemic cardiomyopathy, paroxysmal A. fib on Coumadin who presented to the hospital with shortness of breath.  Patient has multiple medical problems and his mostly noncompliant.  Patient was noted to be hypoxic with oxygen saturation of 70% at home.  He had stopped taking Lasix at home.  Patient was also not taking Coumadin regularly.  He went to [facility] health where he was found to be afebrile, oxygen saturations on 80s on room air, tachycardic.  Chest x-ray showed cardiomegaly with but mild bibasilar opacities.  Creatinine 2.3.  proBNP 3351.  pH 7.3/PCO2 68/PO2 79.  He was admitted to the hospital.  Being diuresed.  He had an episode of lethargic and found to have pH of 7.2 with CO2 retention.  Followed by nephrology and cardiology.   Assessment & Plan:   Principal Problem:   Acute on chronic respiratory failure with hypoxia and hypercapnia (HCC) Active Problems:   NICM (nonischemic cardiomyopathy) (HCC)   OSA (obstructive sleep apnea)   HTN (hypertension)   AKI (acute kidney injury) (HCC)   Renal insufficiency   Acute on chronic systolic CHF (congestive heart failure) (HCC)   Atrial fibrillation, permanent   Subtherapeutic international normalized ratio (INR)   Elevated troponin   Acute respiratory failure with hypoxia and hypercapnia (HCC)   Hypotension  Acute hypoxic and hypercapnic respiratory failure: Multifactorial.  Diastolic dysfunction with fluid overload, morbid obesity and noncompliance to CPAP, obesity hypoventilation. Admitted and treated with IV Lasix with good improvement of hemodynamics as well as renal functions. Was treated with Lasix 80 mg twice a day, now changing to 80 mg by mouth twice a day. Echocardiogram with EF of 55%.  Patient also on  beta-blockers that he will continue.  -4 L last 24 hours. Significant CO2 retention, patient is not using BiPAP prescribed in the hospital. Discussed with patient and wife, they will bring their own CPAP from home and patient will use all night.  Repeat ABG in the morning. Extensive counseling done regarding medications, CPAP compliance and follow-ups.  Acute kidney injury: Creatinine 2.3 on presentation.  Treated with diuresis with improvement.  Renal function normalized.  A. fib with RVR: Subtherapeutic INR.  Currently rate controlled on carvedilol.  INR 1.6 today.  Pharmacy dosing.  On heparin GTT for bridging.  Bilateral bibasilar opacity on chest x-ray: Probably fluid overload.  Treated with Rocephin.  Patient also on doxycycline.  Will discontinue Rocephin.  Acute on chronic lymphedema bilateral lower extremities: Less likely acute infection.  Mostly chronic lymphedema.  Seen by wound care.  Patient has bilateral Unna boot and compression stockings.  Acute metabolic encephalopathy: Due to hypercapnia.  Improved.   DVT prophylaxis: Heparin GTT Code Status: Full code Family Communication: Wife on the phone Disposition Plan: Home after hospitalization.  Anticipate tomorrow.   Consultants:   Cardiology, signed off  Nephrology, signed off  Procedures:   None  Antimicrobials:   Rocephin, 01/21/2019--01/24/2019  Doxycycline, 01/23/2019---   Subjective: Patient was seen and examined.  Was examined in the evening again because he wanted to go home.  We discussed about blood gas findings. No overnight events.  He could not wear BiPAP all night.  He has not ambulated much but denies any shortness of breath at rest.  Objective: Vitals:   01/24/19 0612 01/24/19 0906 01/24/19  1457 01/24/19 1506  BP: 112/69  (!) 133/91   Pulse: (!) 104 98 95 97  Resp:      Temp: 98.6 F (37 C)  99.2 F (37.3 C)   TempSrc: Oral  Oral   SpO2: 96%  (!) 89% 95%  Weight: (!) 267.6 kg     Height:         Intake/Output Summary (Last 24 hours) at 01/24/2019 1551 Last data filed at 01/24/2019 1459 Gross per 24 hour  Intake 3006.07 ml  Output 4370 ml  Net -1363.93 ml   Filed Weights   01/21/19 0418 01/22/19 0631 01/24/19 0612  Weight: (!) 221.2 kg (!) 217.6 kg (!) 267.6 kg    Examination:  General exam: Appears calm and comfortable, on room air at rest.  Looks comfortable.  Morbidly obese. Respiratory system: Clear to auscultation. Respiratory effort normal. Cardiovascular system: S1 & S2 heard, irregularly irregular tachycardic.  No JVD, murmurs, rubs, gallops or clicks. No pedal edema. Gastrointestinal system: Abdomen is nondistended, soft and nontender. No organomegaly or masses felt. Normal bowel sounds heard.  Obese and pendulous. Central nervous system: Alert and oriented. No focal neurological deficits. Extremities: Symmetric 5 x 5 power. Skin: No rashes, lesions or ulcers Psychiatry: Judgement and insight appear normal. Mood & affect appropriate.   Bilateral lower leg with chronic venous stasis changes, lymphedema.  Wrapped with Unna boot not removed by me.  Data Reviewed: I have personally reviewed following labs and imaging studies  CBC: Recent Labs  Lab 01/21/19 0811 01/23/19 0827 01/24/19 0033  WBC 7.2 5.3 6.2  HGB 14.1 13.1 12.0*  HCT 45.0 43.7 39.7  MCV 102.5* 107.1* 104.5*  PLT 158 136* 125*   Basic Metabolic Panel: Recent Labs  Lab 01/21/19 0510 01/22/19 0518 01/23/19 0827 01/24/19 0033  NA 138 141 140 139  K 4.9 5.0 4.8 4.5  CL 93* 100 96* 97*  CO2 33* 33* 38* 36*  GLUCOSE 101* 117* 93 103*  BUN 42* 41* 29* 27*  CREATININE 2.35* 1.72* 1.16 1.15  CALCIUM 9.1 8.7* 8.7* 8.3*   GFR: Estimated Creatinine Clearance: 157.7 mL/min (by C-G formula based on SCr of 1.15 mg/dL). Liver Function Tests: No results for input(s): AST, ALT, ALKPHOS, BILITOT, PROT, ALBUMIN in the last 168 hours. No results for input(s): LIPASE, AMYLASE in the last 168 hours.  No results for input(s): AMMONIA in the last 168 hours. Coagulation Profile: Recent Labs  Lab 01/21/19 1852 01/22/19 0518 01/23/19 0827 01/24/19 0033  INR 1.6* 1.4* 1.5* 1.6*   Cardiac Enzymes: No results for input(s): CKTOTAL, CKMB, CKMBINDEX, TROPONINI in the last 168 hours. BNP (last 3 results) No results for input(s): PROBNP in the last 8760 hours. HbA1C: No results for input(s): HGBA1C in the last 72 hours. CBG: Recent Labs  Lab 01/21/19 2034  GLUCAP 131*   Lipid Profile: No results for input(s): CHOL, HDL, LDLCALC, TRIG, CHOLHDL, LDLDIRECT in the last 72 hours. Thyroid Function Tests: No results for input(s): TSH, T4TOTAL, FREET4, T3FREE, THYROIDAB in the last 72 hours. Anemia Panel: No results for input(s): VITAMINB12, FOLATE, FERRITIN, TIBC, IRON, RETICCTPCT in the last 72 hours. Sepsis Labs: Recent Labs  Lab 01/21/19 0510 01/21/19 1131  PROCALCITON <0.10  --   LATICACIDVEN  --  0.9    Recent Results (from the past 240 hour(s))  SARS Coronavirus 2 (CEPHEID - Performed in [facility] hospital lab), Hosp Order     Status: None   Collection Time: 01/21/19  4:58 AM  Result  Value Ref Range Status   SARS Coronavirus 2 NEGATIVE NEGATIVE Final    Comment: (NOTE) If result is NEGATIVE SARS-CoV-2 target nucleic acids are NOT DETECTED. The SARS-CoV-2 RNA is generally detectable in upper and lower  respiratory specimens during the acute phase of infection. The lowest  concentration of SARS-CoV-2 viral copies this assay can detect is 250  copies / mL. A negative result does not preclude SARS-CoV-2 infection  and should not be used as the sole basis for treatment or other  patient management decisions.  A negative result may occur with  improper specimen collection / handling, submission of specimen other  than nasopharyngeal swab, presence of viral mutation(s) within the  areas targeted by this assay, and inadequate number of viral copies  (<250 copies / mL). A  negative result must be combined with clinical  observations, patient history, and epidemiological information. If result is POSITIVE SARS-CoV-2 target nucleic acids are DETECTED. The SARS-CoV-2 RNA is generally detectable in upper and lower  respiratory specimens dur ing the acute phase of infection.  Positive  results are indicative of active infection with SARS-CoV-2.  Clinical  correlation with patient history and other diagnostic information is  necessary to determine patient infection status.  Positive results do  not rule out bacterial infection or co-infection with other viruses. If result is PRESUMPTIVE POSTIVE SARS-CoV-2 nucleic acids MAY BE PRESENT.   A presumptive positive result was obtained on the submitted specimen  and confirmed on repeat testing.  While 2019 novel coronavirus  (SARS-CoV-2) nucleic acids may be present in the submitted sample  additional confirmatory testing may be necessary for epidemiological  and / or clinical management purposes  to differentiate between  SARS-CoV-2 and other Sarbecovirus currently known to infect humans.  If clinically indicated additional testing with an alternate test  methodology [phone] is advised. The SARS-CoV-2 RNA is generally  detectable in upper and lower respiratory sp ecimens during the acute  phase of infection. The expected result is Negative. Fact Sheet for Patients:  BoilerBrush.com.cyhttps://www.fda.gov/media/136312/download Fact Sheet for Healthcare Providers: https://pope.com/https://www.fda.gov/media/136313/download This test is not yet approved or cleared by the [place] FDA and has been authorized for detection and/or diagnosis of SARS-CoV-2 by FDA under an Emergency Use Authorization (EUA).  This EUA will remain in effect (meaning this test can be used) for the duration of the COVID-19 declaration under Section 564(b)(1) of the Act, 21 U.S.C. section 360bbb-3(b)(1), unless the authorization is terminated or revoked sooner. Performed  at [facility], [address] [place], [place] [address]   Culture, blood (routine x 2)     Status: None (Preliminary result)   Collection Time: 01/21/19 11:24 AM  Result Value Ref Range Status   Specimen Description BLOOD LEFT ANTECUBITAL  Final   Special Requests   Final    BOTTLES DRAWN AEROBIC ONLY Blood Culture adequate volume   Culture   Final    NO GROWTH 3 DAYS Performed at [facility], [address] [place], [place] [address]    Report Status PENDING  Incomplete  Culture, blood (routine x 2)     Status: None (Preliminary result)   Collection Time: 01/21/19 11:30 AM  Result Value Ref Range Status   Specimen Description BLOOD LEFT ANTECUBITAL  Final   Special Requests   Final    BOTTLES DRAWN AEROBIC ONLY Blood Culture adequate volume   Culture   Final    NO GROWTH 3 DAYS Performed at [facility], [address] [place], [place] [address]    Report Status PENDING  Incomplete         Radiology Studies: No results found.      Scheduled Meds: . aspirin EC  81 mg Oral Daily  . carvedilol  3.125 mg Oral BID WC  . furosemide  80 mg Oral BID  . sodium chloride flush  3 mL Intravenous Q12H  . warfarin  10 mg Oral ONCE-1800  . Warfarin - Pharmacist Dosing Inpatient   Does not apply q1800   Continuous Infusions: . sodium chloride 250 mL (01/21/19 1108)  . doxycycline (VIBRAMYCIN) IV 125 mL/hr at 01/24/19 1144  . heparin 2,650 Units/hr (01/24/19 1327)     LOS: 3 days    Time spent: 25 minutes    Dorcas CarrowKuber Shannen Vernon, MD [facility] Pager [phone]  If 7PM-7AM, please contact night-coverage [email or web address] Password [email or web address] 01/24/2019, 3:51 PM

## 2019-01-24 NOTE — Progress Notes (Signed)
CRITICAL VALUE ALERT Arterial Blood Gas Critical Value:  Co2- 85.7  Date & Time Notied:  01/24/2019  0610  Provider Notified: Gilberto Better, NP  Orders Received/Actions taken: Will continue to monitor patient.

## 2019-01-24 NOTE — Progress Notes (Addendum)
Progress Note  Patient Name: Travis Bautista Date of Encounter: 01/24/2019  Primary Cardiologist: Chrystie Nose, MD   Subjective   Feels better today.  Ready to go home. Denies chest pain, shortness of breath.   Inpatient Medications    Scheduled Meds: . aspirin EC  81 mg Oral Daily  . furosemide  80 mg Intravenous Q12H  . sodium chloride flush  3 mL Intravenous Q12H  . Warfarin - Pharmacist Dosing Inpatient   Does not apply q1800   Continuous Infusions: . sodium chloride 250 mL (01/21/19 1108)  . cefTRIAXone (ROCEPHIN)  IV 1 g (01/23/19 0942)  . doxycycline (VIBRAMYCIN) IV 100 mg (01/23/19 2154)  . heparin 2,700 Units/hr (01/24/19 0223)   PRN Meds: sodium chloride, acetaminophen, ondansetron (ZOFRAN) IV, sodium chloride flush, traZODone   Vital Signs    Vitals:   01/23/19 1543 01/23/19 2147 01/23/19 2343 01/24/19 0612  BP: (!) 122/95 114/70  112/69  Pulse: (!) 103 (!) 105 98 (!) 104  Resp:  20 17   Temp: 98.8 F (37.1 C) 98.7 F (37.1 C)  98.6 F (37 C)  TempSrc: Oral Oral  Oral  SpO2: 92%  96% 96%  Weight:    (!) 267.6 kg  Height:        Intake/Output Summary (Last 24 hours) at 01/24/2019 0720 Last data filed at 01/24/2019 0610 Gross per 24 hour  Intake 1318 ml  Output 5350 ml  Net -4032 ml   Filed Weights   01/21/19 0418 01/22/19 0631 01/24/19 0612  Weight: (!) 221.2 kg (!) 217.6 kg (!) 267.6 kg   Physical Exam   General: Morbidly obese, NAD Skin: Warm, dry, intact  Neck: Negative for carotid bruits. No JVD Lungs:Clear to ausculation bilaterally. No wheezes, rales, or rhonchi. Breathing is unlabored Cardiovascular: Irregularly irregular with S1 S2 Abdomen: Soft, non-tender, distended. No obvious abdominal masses. MSK: Strength and tone appear normal for age. 4/4 in all extremities Extremities: 2+ BLE edema. No clubbing or cyanosis. DP/PT pulses 1+ bilaterally Neuro: Alert and oriented. No focal deficits. No facial asymmetry. MAE spontaneously.  Psych: Responds to questions appropriately with normal affect.    Labs    Chemistry Recent Labs  Lab 01/22/19 0518 01/23/19 0827 01/24/19 0033  NA 141 140 139  K 5.0 4.8 4.5  CL 100 96* 97*  CO2 33* 38* 36*  GLUCOSE 117* 93 103*  BUN 41* 29* 27*  CREATININE 1.72* 1.16 1.15  CALCIUM 8.7* 8.7* 8.3*  GFRNONAA 44* >60 >60  GFRAA 51* >60 >60  ANIONGAP 8 6 6      Hematology Recent Labs  Lab 01/21/19 0811 01/23/19 0827 01/24/19 0033  WBC 7.2 5.3 6.2  RBC 4.39 4.08* 3.80*  HGB 14.1 13.1 12.0*  HCT 45.0 43.7 39.7  MCV 102.5* 107.1* 104.5*  MCH 32.1 32.1 31.6  MCHC 31.3 30.0 30.2  RDW 14.6 14.4 14.4  PLT 158 136* 125*    Cardiac EnzymesNo results for input(s): TROPONINI in the last 168 hours. No results for input(s): TROPIPOC in the last 168 hours.   BNPNo results for input(s): BNP, PROBNP in the last 168 hours.   DDimer No results for input(s): DDIMER in the last 168 hours.   Radiology    No results found.  Telemetry    01/24/2019 atrial fibrillation with HR in the 90-100 range- Personally Reviewed  ECG    01/21/2019 atrial fibrillation with HR 101- Personally Reviewed  Cardiac Studies   Echocardiogram 01/21/2019: 1. The left  ventricle has normal systolic function, with an ejection fraction of 55-60%. The cavity size was normal. Left ventricular diastolic Doppler parameters are indeterminate. 2. RV not seen well enough to evaluate function. Poor acoustic windows limit study. 3. The tricuspid valve is grossly normal. 4. The aortic valve is abnormal. Mild thickening of the aortic valve. Mild calcification of the aortic valve. 5. The aortic root and ascending aorta are normal in size and structure. 6. The inferior vena cava was dilated in size with <50% respiratory variability. 7. The left ventricular function has unchanged. 8. The interatrial septum was not well visualized.  Patient Profile     58 y.o. male with a hx of morbid obesity with a BMI  >60,nonischemic cardiomyopathy now with improved LV function per echocardiogram, permanent atrial fibrillation on chronic Coumadin therapy, hypertensionand goutwho is being seen today for the evaluation ofCHF exacerbationat the request ofDr. Regalado.  Assessment & Plan    1.  Acute respiratory failure secondary to acute on chronic heart failure versus CAP: -Initial cardiomyopathy from 2009 with LVEF of 20 to 25% with repeat echocardiogram 01/21/2019 with normalized LV function at 55 to 60% -IV Lasix initially on hold secondary to hypotension and AKI with a presenting creatinine of 2.35 -Creatinine improving down to 1.16 yesterday and 1.15 today  -Weight, inaccurate weight recordings, 487lb on presentation with a weight of 479lb on 01/22/2019 -I&O, net -5.6 L since hospital admission -Nephrology consulted for diuretic assistance>> IV Lasix 80 mg twice daily restarted 01/23/2019  2.  Persistent atrial fibrillation on chronic Coumadin therapy: -Failed DCCV in the past -Currently on heparin gtt per pharmacy until INR therapeutic as he was subtherapeutic on presentation -Coumadin per pharmacy once INR therapeutic, followed OP by [facility] -INR today, 1.6 -Will restart carvedilol 3.125 mg twice daily today given mildly elevated rates  3.  History of nonischemic cardiomyopathy with normalized LV function: -Initial LV function at 20 to 25% in 2009, nonischemic per cardiac cath -Repeat echocardiogram from 2015 with improved LV function>>> stable per echocardiogram 01/21/2019 -Continue IV Lasix 80 mg twice daily>> once fluid volume status more stabilized transition to 80 mg twice daily p.o.  4.  Hypotension: -Stabilized, 112/69, 114/70, 122/95 -Was initially more hypotensive on presentation making it difficult for IV diuretics -We will restart carvedilol 3.125 mg twice daily today  5.  AKI: -Initial creatinine, 2.35 well above his baseline of 1.1-1.2 per [facility] report -Stabilized,  creatinine today 1.15 -Nephrology following for diuretic assistance  6.  OSA: -Reports compliance with CPAP  7.  Bilateral bibasilar opacities on CXR: -Treated per IM with IV antibiotics   8.  Chronic lower extremity edema: -Has chronic lower extremity edema in the setting of venous stasis secondary to morbid obesity -Continue with bilateral compression stockings/wraps>>folows closely with PCP for wraps and LE skin care  -Renal recommends restart of diureticsas of 01/23/2019 -Monitor progression    Signed, Kathyrn Drown NP-C [place] Pager: [phone] 01/24/2019, 7:20 AM     For questions or updates, please contact   Please consult [email or web address] for contact info under Cardiology/STEMI.  The patient was seen, examined and discussed with Kathyrn Drown, NP  and I agree with the above.   The patient is feeling better than his baseline, he admits to non-compliance with diuretics and CPAP, we discussed importance of compliance, daily weights and low sodium diet. Crea back to baseline,. We will switch to Lasix 80 mg po BID, he is ok to be discharged today. We will arrange an office  visit with lab work in 2 weeks.   Tobias AlexanderKatarina Lizzie Cokley, MD 01/24/2019

## 2019-01-24 NOTE — Progress Notes (Signed)
ANTICOAGULATION CONSULT NOTE  Pharmacy Consult for Heparin Indication: atrial fibrillation  No Known Allergies  Patient Measurements: Height: 6' (182.9 cm) Weight: (!) 479 lb 11.2 oz (217.6 kg) IBW/kg (Calculated) : 77.6 Heparin Dosing Weight: 134.3 kg  Vital Signs: Temp: 98.7 F (37.1 C) (06/07 2147) Temp Source: Oral (06/07 2147) BP: 114/70 (06/07 2147) Pulse Rate: 98 (06/07 2343)  Labs: Recent Labs    01/21/19 0811  01/22/19 0518  01/23/19 0827 01/23/19 1100 01/23/19 1732 01/24/19 0033  HGB 14.1  --   --   --  13.1  --   --  12.0*  HCT 45.0  --   --   --  43.7  --   --  39.7  PLT 158  --   --   --  136*  --   --  125*  LABPROT  --    < > 17.4*  --  17.9*  --   --  18.6*  INR  --    < > 1.4*  --  1.5*  --   --  1.6*  HEPARINUNFRC  --   --   --    < >  --  0.35 0.18* 0.25*  CREATININE  --   --  1.72*  --  1.16  --   --  1.15   < > = values in this interval not displayed.   Estimated Creatinine Clearance: 137.1 mL/min (by C-G formula based on SCr of 1.15 mg/dL).  Assessment: 30 yoM admitted for CHF exacerbation and AKI. PMH afib on warfarin PTA. Pharmacy already consulted to continue dosing warfarin, now consulted to start IV heparin bridge as INR below goal at 1.4. CBC WNL, no bleeding noted.  6/8 AM update: heparin level below goal, no issues per RN.   Goal of Therapy:  Heparin level 0.3-0.7 units/ml Monitor platelets by anticoagulation protocol: Yes   Plan:  Inc heparin to 2700 units/hr Check heparin level in 6-8 hours Daily heparin level, INR, and CBC Monitor s/sx of bleeding  Narda Bonds, PharmD, BCPS Clinical Pharmacist Phone: [phone]

## 2019-01-24 NOTE — Progress Notes (Signed)
Patient is on his home NIV machine at this time.

## 2019-01-24 NOTE — Progress Notes (Signed)
[place] for Heparin Indication: atrial fibrillation  No Known Allergies  Patient Measurements: Height: 6' (182.9 cm) Weight: (!) 590 lb (267.6 kg) IBW/kg (Calculated) : 77.6 Heparin Dosing Weight: 134.3 kg  Vital Signs: Temp: 99.2 F (37.3 C) (06/08 1457) Temp Source: Oral (06/08 1457) BP: 128/88 (06/08 1641) Pulse Rate: 106 (06/08 1641)  Labs: Recent Labs    01/22/19 0518  01/23/19 0827  01/24/19 0033 01/24/19 1201 01/24/19 1934  HGB  --   --  13.1  --  12.0*  --   --   HCT  --   --  43.7  --  39.7  --   --   PLT  --   --  136*  --  125*  --   --   LABPROT 17.4*  --  17.9*  --  18.6*  --   --   INR 1.4*  --  1.5*  --  1.6*  --   --   HEPARINUNFRC  --    < >  --    < > 0.25* 0.69 0.63  CREATININE 1.72*  --  1.16  --  1.15  --   --    < > = values in this interval not displayed.   Estimated Creatinine Clearance: 157.7 mL/min (by C-G formula based on SCr of 1.15 mg/dL).  Assessment: 59 yoM admitted for CHF exacerbation and AKI. PMH afib on warfarin PTA. Pharmacy already consulted to continue dosing warfarin, now consulted to start IV heparin bridge as INR below goal.   Heparin level this evening came back therapeutic at 0.63,on 2650 units/hr. Hgb stable at 12, plt 125. No s/sx of bleeding. No infusion issues.   Goal of Therapy:  Heparin level 0.3-0.7 units/ml Monitor platelets by anticoagulation protocol: Yes   Plan:  -Continue heparin drip at 2650 units/hr (26.5 ml/hr) -Monitor daily HL, CBC, and for s/sx of bleeding   Thank you for allowing pharmacy to be a part of this patient's care.  Antonietta Jewel, PharmD, [place] Clinical Pharmacist  Pager: [phone] Phone: [phone] 01/24/2019 8:11 PM   **Pharmacist phone directory can now be found on [email or web address] ([email or web address]).  Listed under [place].

## 2019-01-24 NOTE — Progress Notes (Signed)
OT Cancellation Note  Patient Details Name: Travis Bautista MRN: [id number] DOB: 06/29/1961   Cancelled Treatment:    Reason Eval/Treat Not Completed: Medical issues which prohibited therapy, pt with rapid response this AM and currently on BiPAP.  OT will return as able and medically appropriate.   Delight Stare, OT Acute Rehabilitation Services Pager [phone] Office [phone]  Delight Stare 01/24/2019, 10:01 AM

## 2019-01-24 NOTE — Progress Notes (Signed)
ANTICOAGULATION CONSULT NOTE  Pharmacy Consult for Heparin Indication: atrial fibrillation  No Known Allergies  Patient Measurements: Height: 6' (182.9 cm) Weight: (!) 590 lb (267.6 kg) IBW/kg (Calculated) : 77.6 Heparin Dosing Weight: 134.3 kg  Vital Signs: Temp: 98.6 F (37 C) (06/08 0612) Temp Source: Oral (06/08 0612) BP: 112/69 (06/08 0612) Pulse Rate: 98 (06/08 0906)  Labs: Recent Labs    01/22/19 0518  01/23/19 0827  01/23/19 1732 01/24/19 0033 01/24/19 1201  HGB  --   --  13.1  --   --  12.0*  --   HCT  --   --  43.7  --   --  39.7  --   PLT  --   --  136*  --   --  125*  --   LABPROT 17.4*  --  17.9*  --   --  18.6*  --   INR 1.4*  --  1.5*  --   --  1.6*  --   HEPARINUNFRC  --    < >  --    < > 0.18* 0.25* 0.69  CREATININE 1.72*  --  1.16  --   --  1.15  --    < > = values in this interval not displayed.   Estimated Creatinine Clearance: 157.7 mL/min (by C-G formula based on SCr of 1.15 mg/dL).  Assessment: 38 yoM admitted for CHF exacerbation and AKI. PMH afib on warfarin PTA. Pharmacy already consulted to continue dosing warfarin, now consulted to start IV heparin bridge as INR below goal.   Heparin level this afternoon is therapeutic but on the higher end of the range (HL 0.69, goal of 0.3-0.7). INR remains SUBtherapeutic (INR 1.6 << 1.5, goal of 2-3). No issues with heparin drip per RN report.   Goal of Therapy:  Heparin level 0.3-0.7 units/ml Monitor platelets by anticoagulation protocol: Yes   Plan:  - Reduce Heparin drip rate slightly to 2650 units/hr (26.5 ml/hr) - Warfarin 10 mg x 1 dose at 1800 today - Will continue to monitor for any signs/symptoms of bleeding and will follow up with heparin level in 6 hours and INR in the AM  Thank you for allowing pharmacy to be a part of this patient's care.  Alycia Rossetti, PharmD, BCPS Clinical Pharmacist Clinical phone for 01/24/2019: [phone] 01/24/2019 1:31 PM   **Pharmacist phone directory can now  be found on [email or web address] ([email or web address]).  Listed under [place].

## 2019-01-24 NOTE — Progress Notes (Signed)
ABG collected  

## 2019-01-24 NOTE — Progress Notes (Signed)
Spoke to patient this morning and he is agreeable to wear Bi-pap after he eats breakfast. I will continue to monitor closely.   Saddie Benders RN, BSN

## 2019-01-24 NOTE — Progress Notes (Signed)
Patient removes mask wishes not to wear NIV QHS.

## 2019-01-24 NOTE — Progress Notes (Signed)
PT Cancellation Note  Patient Details Name: Travis Bautista MRN: [id number] DOB: [birth year]   Cancelled Treatment:    Reason Eval/Treat Not Completed: (P) Medical issues which prohibited therapy Pt with Rapid Response this morning and currently on BiPAP. PT will check back this afternoon for Evaluation as able.  Illyanna Petillo B. Migdalia Dk PT, DPT Acute Rehabilitation Services Pager [phone] Office [phone]    [place] 01/24/2019, 9:54 AM

## 2019-01-25 LAB — BLOOD GAS, ARTERIAL
Acid-Base Excess: 14 mmol/L — ABNORMAL HIGH (ref 0.0–2.0)
Bicarbonate: 38.6 mmol/L — ABNORMAL HIGH (ref 20.0–28.0)
Delivery systems: POSITIVE
Drawn by: 441661
FIO2: 0.21
O2 Saturation: 92.7 %
Patient temperature: 98.6
pCO2 arterial: 53.6 mmHg — ABNORMAL HIGH (ref 32.0–48.0)
pH, Arterial: 7.471 — ABNORMAL HIGH (ref 7.350–7.450)
pO2, Arterial: 62.9 mmHg — ABNORMAL LOW (ref 83.0–108.0)

## 2019-01-25 LAB — PROTIME-INR
INR: 1.7 — ABNORMAL HIGH (ref 0.8–1.2)
Prothrombin Time: 19.9 seconds — ABNORMAL HIGH (ref 11.4–15.2)

## 2019-01-25 LAB — HEPARIN LEVEL (UNFRACTIONATED): Heparin Unfractionated: 0.59 IU/mL (ref 0.30–0.70)

## 2019-01-25 MED ORDER — CARVEDILOL 6.25 MG PO TABS
6.2500 mg | ORAL_TABLET | Freq: Two times a day (BID) | ORAL | Status: DC
  Administered 2019-01-25: 6.25 mg via ORAL
  Filled 2019-01-25: qty 1

## 2019-01-25 MED ORDER — FUROSEMIDE 40 MG PO TABS: 80.0000 mg | ORAL_TABLET | Freq: Two times a day (BID) | ORAL | 11 refills | Status: AC

## 2019-01-25 MED ORDER — WARFARIN SODIUM 10 MG PO TABS: 10.0000 mg | ORAL_TABLET | Freq: Once | ORAL | Status: DC

## 2019-01-25 NOTE — Progress Notes (Signed)
Patient ABG was drawn while patient was on his home unit CPAP machine home settings regimen were min 5 - max 20cmh2o auto titrate based on patient body demands. No distress or complications noted.

## 2019-01-25 NOTE — Progress Notes (Signed)
ANTICOAGULATION CONSULT NOTE - Follow-Up  Pharmacy Consult for Heparin Indication: atrial fibrillation  No Known Allergies  Patient Measurements: Height: 6' (182.9 cm) Weight: (!) 590 lb (267.6 kg) IBW/kg (Calculated) : 77.6 Heparin Dosing Weight: 134.3 kg  Vital Signs: Temp: 98.6 F (37 C) (06/09 0428) Temp Source: Oral (06/09 0428) BP: 114/75 (06/09 0913) Pulse Rate: 117 (06/09 0913)  Labs: Recent Labs    01/23/19 0827  01/24/19 0033 01/24/19 1201 01/24/19 1934 01/25/19 0400  HGB 13.1  --  12.0*  --   --   --   HCT 43.7  --  39.7  --   --   --   PLT 136*  --  125*  --   --   --   LABPROT 17.9*  --  18.6*  --   --  19.9*  INR 1.5*  --  1.6*  --   --  1.7*  HEPARINUNFRC  --    < > 0.25* 0.69 0.63 0.59  CREATININE 1.16  --  1.15  --   --   --    < > = values in this interval not displayed.   Estimated Creatinine Clearance: 157.7 mL/min (by C-G formula based on SCr of 1.15 mg/dL).  Assessment: 58 yo M admitted for CHF exacerbation and AKI. PMH afib on warfarin PTA (?compliance).  Patient continues on heparin at 2650 units/hr with therapeutic level.  INR slow to rise after increased warfarin doses.    Hgb stable at 13.1, plt 136. No s/sx of bleeding. No infusion issues.   Goal of Therapy:  Heparin level 0.3-0.7 units/ml Monitor platelets by anticoagulation protocol: Yes   Plan:  -Continue heparin drip at 2650 units/hr (26.5 ml/hr) -Warfarin 10mg  PO x 1 tonight -Monitor daily heparin level, CBC, INR, and for s/sx of bleeding   Thank you for allowing pharmacy to be a part of this patient's care.  [employer], Pharm.D., BCPS Clinical Pharmacist Clinical phone for 01/25/2019 from 8:30-4:00 is [phone].  **Pharmacist phone directory can now be found on [email or web address] ([email or web address]).  Listed under [place].  01/25/2019 9:48 AM

## 2019-01-25 NOTE — Progress Notes (Signed)
ABG collected  

## 2019-01-25 NOTE — Discharge Summary (Signed)
Physician Discharge Summary  Travis Bautista WUJ:[id number] DOB: 1961/03/12 DOA: 01/21/2019  PCP: Chilton Greathouse, MD  Admit date: 01/21/2019 Discharge date: 01/25/2019  Admitted From: Home. Disposition: Home.  Recommendations for Outpatient Follow-up:  1. Follow up with PCP in 1-2 weeks Check your blood pressures at home and keep a log Continue Coumadin.  Needs INR in 3 to 4 days. Home Health: Not applicable Equipment/Devices: Not applicable  Discharge Condition: Stable CODE STATUS: Full code Diet recommendation: Low-salt diet  Brief/Interim Summary: Patient is 58 year old man with history of morbid obesity, sleep apnea, obesity hypoventilation, nonischemic cardiomyopathy, paroxysmal A. fib on Coumadin who presented to the hospital with shortness of breath.  Patient has multiple medical problems and his mostly noncompliant.  Patient was noted to be hypoxic with oxygen saturation of 70% at home.  He had stopped taking Lasix at home.  Patient was also not taking Coumadin regularly.  He went to [facility] health where he was found to be afebrile, oxygen saturations on 80s on room air, tachycardic.  Chest x-ray showed cardiomegaly with but mild bibasilar opacities.  Creatinine 2.3.  proBNP 3351.  pH 7.3/PCO2 68/PO2 79.  He was admitted to the hospital.  Being diuresed.  He had an episode of lethargic and found to have pH of 7.2 with CO2 retention.  Followed by nephrology and cardiology.  Discharge Diagnoses:  Principal Problem:   Acute on chronic respiratory failure with hypoxia and hypercapnia (HCC) Active Problems:   NICM (nonischemic cardiomyopathy) (HCC)   OSA (obstructive sleep apnea)   HTN (hypertension)   AKI (acute kidney injury) (HCC)   Renal insufficiency   Acute on chronic systolic CHF (congestive heart failure) (HCC)   Atrial fibrillation, permanent   Subtherapeutic international normalized ratio (INR)   Elevated troponin   Acute respiratory failure with hypoxia and hypercapnia  (HCC)   Hypotension  Acute hypoxic and hypercapnic respiratory failure: Multifactorial.  Diastolic dysfunction with fluid overload, morbid obesity and noncompliance to CPAP, obesity hypoventilation. Admitted and treated with IV Lasix with good improvement of hemodynamics as well as renal functions. Was treated with Lasix 80 mg twice a day, now changing to 80 mg by mouth twice a day on discharge. Echocardiogram with EF of 55%.  Patient also on beta-blockers that he will continue.  His ejection fraction has improved, however he will continue captopril and Aldactone. Significant CO2 retention, patient used to is on CPAP with improvement. Strongly recommended to wear at least 6 hours of CPAP at night.  Acute kidney injury: Creatinine 2.3 on presentation.  Treated with diuresis with improvement.  Renal function normalized.  A. fib with RVR: Subtherapeutic INR.  Currently rate controlled on carvedilol.  INR 1.7 today.  No need for bridging at home.  He will continue take 7.5 mg of Coumadin.  Will need INR monitoring outpatient.  Acute on chronic lymphedema bilateral lower extremities: Less likely acute infection.  Mostly chronic lymphedema.  Seen by wound care.  Patient has bilateral Unna boot and compression stockings. Patient will need aggressive compressions and elevation of the part.  Acute metabolic encephalopathy: Due to hypercapnia.  Improved.  Normalized.  Discussed with patient and wife.  Detailed instructions given.  Stable to be discharged.   Discharge Instructions  Discharge Instructions    Call MD for:  difficulty breathing, headache or visual disturbances   Complete by:  As directed    Diet - low sodium heart healthy   Complete by:  As directed    Discharge instructions   Complete  by:  As directed    Can take carvedilol in the evening, Start your other blood pressure medications tomorrow at home. Recheck INR in 3-4 days   Increase activity slowly   Complete by:  As  directed      Allergies as of 01/25/2019   No Known Allergies     Medication List    TAKE these medications   allopurinol 300 MG tablet Commonly known as:  ZYLOPRIM Take 1 tablet (300 mg total) by mouth daily.   aspirin EC 81 MG tablet Take 81 mg by mouth daily.   calcium carbonate 1250 (500 Ca) MG tablet Commonly known as:  OS-CAL - dosed in mg of elemental calcium Take 1 tablet by mouth 2 (two) times daily with a meal.   carvedilol 12.5 MG tablet Commonly known as:  COREG Take 1 tablet (12.5 mg total) by mouth 2 (two) times daily with a meal.   diltiazem 240 MG 24 hr capsule Commonly known as:  CARDIZEM CD Take 1 capsule (240 mg total) by mouth daily.   furosemide 40 MG tablet Commonly known as:  LASIX Take 2 tablets (80 mg total) by mouth 2 (two) times daily. TAKE 2 TABLETS (80MG ) BY MOUTH IN THE AM AND 1 TABLET (40MG ) IN THE PM. What changed:    how much to take  how to take this  when to take this   multivitamin tablet Take 1 tablet by mouth daily.   ramipril 5 MG capsule Commonly known as:  ALTACE Take 1 capsule (5 mg total) by mouth daily. Please keep your upcoming appointment for refills.   spironolactone 25 MG tablet Commonly known as:  ALDACTONE Take 1 tablet (25 mg total) by mouth daily.   traZODone 50 MG tablet Commonly known as:  DESYREL Take 50-100 mg by mouth at bedtime as needed for sleep.   triamcinolone cream 0.1 % Commonly known as:  KENALOG Apply 1 application topically daily.   warfarin 5 MG tablet Commonly known as:  COUMADIN TAKE 1 TO 1 & 1/2 TABLETS BY MOUTH DAILY AS DIRECTED. NEED INR What changed:    how much to take  how to take this  when to take this  additional instructions      Follow-up Information    Marcelino Duster, PA Follow up on 02/07/2019.   Specialties:  Physician Assistant, Cardiology, Radiology Why:  Your follow up willbe on 02/07/2019 at 0815am  Contact information: [address] [place] [address] [phone]          No Known Allergies  Consultations:  Cardiology  Nephrology   Procedures/Studies: Ct Head Wo Contrast  Result Date: 01/21/2019 CLINICAL DATA:  Unexplained altered level of consciousness. Morbid obesity. EXAM: CT HEAD WITHOUT CONTRAST TECHNIQUE: Contiguous axial images were obtained from the base of the skull through the vertex without intravenous contrast. COMPARISON:  None. FINDINGS: Brain: No evidence of acute infarction, hemorrhage, hydrocephalus, extra-axial collection or mass lesion/mass effect. Normal for age cerebral volume. No white matter disease. Vascular: Calcification of the cavernous internal carotid arteries consistent with cerebrovascular atherosclerotic disease. No signs of intracranial large vessel occlusion. Skull: Normal. Negative for fracture or focal lesion. Sinuses/Orbits: No acute finding. Retention cyst LEFT maxillary sinus. Unremarkable orbits. Other: None. IMPRESSION: Negative exam. Electronically Signed   By: Elsie Stain M.D.   On: 01/21/2019 22:33   US Renal  Result Date: 01/21/2019 CLINICAL DATA:  Acute kidney injury EXAM: RENAL ULTRASOUND COMPARISON:  None. FINDINGS: Right Kidney:  Renal measurements: 10.1 x 5.7 x 6.4 cm = volume: 188.7 mL . Echogenicity is within normal limits. Renal cortical thickness is low normal. Within normal limits. No mass, perinephric fluid, or hydronephrosis visualized. No sonographically demonstrable calculus or ureterectasis. Left Kidney: Renal measurements: 12.2 x 5.8 x 6.3 cm = volume: 232.1 mL. Echogenicity and renal cortical thickness are within normal limits. No mass, perinephric fluid, or hydronephrosis visualized. No sonographically demonstrable calculus or ureterectasis. Bladder: Empty and could not be assessed. IMPRESSION: No obstructing focus evident in either kidney. Renal echogenicity normal bilaterally. Renal cortical thickness is within normal limits, although renal cortical  thickness on the right is at the lower limits of normal. Electronically Signed   By: Bretta BangWilliam  Woodruff III M.D.   On: 01/21/2019 21:56   Dg Chest Port 1 View  Result Date: 01/21/2019 CLINICAL DATA:  Hypotension. EXAM: PORTABLE CHEST 1 VIEW COMPARISON:  06/08/2015 and 10/23/2014 FINDINGS: Chronic cardiomegaly. Slight pulmonary vascular prominence. No discrete infiltrates or effusions. No acute bone abnormality. IMPRESSION: Mild cardiomegaly with slight pulmonary vascular congestion. Electronically Signed   By: Francene BoyersJames  Maxwell M.D.   On: 01/21/2019 12:08       Subjective: Patient seen and examined.  No overnight events.  He wore his home CPAP for 9 hours resulting in overcorrection of his acidosis.  Advised to wait for 6 hours. Denies any shortness of breath or palpitations.  Heart rate is well controlled.  He is eager to go home.   Discharge Exam: Vitals:   01/24/19 2124 01/25/19 0428  BP: (!) 113/59 (!) 146/85  Pulse: (!) 105 (!) 101  Resp:    Temp: 98.6 F (37 C) 98.6 F (37 C)  SpO2: 92% 92%   Vitals:   01/24/19 1506 01/24/19 1641 01/24/19 2124 01/25/19 0428  BP:  128/88 (!) 113/59 (!) 146/85  Pulse: 97 (!) 106 (!) 105 (!) 101  Resp:      Temp:   98.6 F (37 C) 98.6 F (37 C)  TempSrc:   Oral Oral  SpO2: 95%  92% 92%  Weight:      Height:        General: Pt is alert, awake, not in acute distress, on room air. Cardiovascular: Irregularly irregular, S1/S2 +, no rubs, no gallops Respiratory: CTA bilaterally, no wheezing, no rhonchi Abdominal: Soft, NT, ND, bowel sounds +, obese and pendulous Extremities:  no cyanosis, chronic lymphedema, venous stasis changes in pigmentation.  None pedal edema 4+.    The results of significant diagnostics from this hospitalization (including imaging, microbiology, ancillary and laboratory) are listed below for reference.     Microbiology: Recent Results (from the past 240 hour(s))  SARS Coronavirus 2 (CEPHEID - Performed in [facility] hospital lab), Hosp Order     Status: None   Collection Time: 01/21/19  4:58 AM  Result Value Ref Range Status   SARS Coronavirus 2 NEGATIVE NEGATIVE Final    Comment: (NOTE) If result is NEGATIVE SARS-CoV-2 target nucleic acids are NOT DETECTED. The SARS-CoV-2 RNA is generally detectable in upper and lower  respiratory specimens during the acute phase of infection. The lowest  concentration of SARS-CoV-2 viral copies this assay can detect is 250  copies / mL. A negative result does not preclude SARS-CoV-2 infection  and should not be used as the sole basis for treatment or other  patient management decisions.  A negative result may occur with  improper specimen collection / handling, submission of specimen other  than nasopharyngeal swab, presence  of viral mutation(s) within the  areas targeted by this assay, and inadequate number of viral copies  (<250 copies / mL). A negative result must be combined with clinical  observations, patient history, and epidemiological information. If result is POSITIVE SARS-CoV-2 target nucleic acids are DETECTED. The SARS-CoV-2 RNA is generally detectable in upper and lower  respiratory specimens dur ing the acute phase of infection.  Positive  results are indicative of active infection with SARS-CoV-2.  Clinical  correlation with patient history and other diagnostic information is  necessary to determine patient infection status.  Positive results do  not rule out bacterial infection or co-infection with other viruses. If result is PRESUMPTIVE POSTIVE SARS-CoV-2 nucleic acids MAY BE PRESENT.   A presumptive positive result was obtained on the submitted specimen  and confirmed on repeat testing.  While 2019 novel coronavirus  (SARS-CoV-2) nucleic acids may be present in the submitted sample  additional confirmatory testing may be necessary for epidemiological  and / or clinical management purposes  to differentiate between  SARS-CoV-2 and  other Sarbecovirus currently known to infect humans.  If clinically indicated additional testing with an alternate test  methodology [phone]) is advised. The SARS-CoV-2 RNA is generally  detectable in upper and lower respiratory sp ecimens during the acute  phase of infection. The expected result is Negative. Fact Sheet for Patients:  StrictlyIdeas.no Fact Sheet for Healthcare Providers: BankingDealers.co.za This test is not yet approved or cleared by the [place] FDA and has been authorized for detection and/or diagnosis of SARS-CoV-2 by FDA under an Emergency Use Authorization (EUA).  This EUA will remain in effect (meaning this test can be used) for the duration of the COVID-19 declaration under Section 564(b)(1) of the Act, 21 U.S.C. section 360bbb-3(b)(1), unless the authorization is terminated or revoked sooner. Performed at [facility], [place] [address] [place], [place] [address]   Culture, blood (routine x 2)     Status: None (Preliminary result)   Collection Time: 01/21/19 11:24 AM  Result Value Ref Range Status   Specimen Description BLOOD LEFT ANTECUBITAL  Final   Special Requests   Final    BOTTLES DRAWN AEROBIC ONLY Blood Culture adequate volume   Culture   Final    NO GROWTH 3 DAYS Performed at [facility], [place] [address] [place], [place] [address]    Report Status PENDING  Incomplete  Culture, blood (routine x 2)     Status: None (Preliminary result)   Collection Time: 01/21/19 11:30 AM  Result Value Ref Range Status   Specimen Description BLOOD LEFT ANTECUBITAL  Final   Special Requests   Final    BOTTLES DRAWN AEROBIC ONLY Blood Culture adequate volume   Culture   Final    NO GROWTH 3 DAYS Performed at [facility], [place] [address] [place], [place] [address]    Report Status PENDING  Incomplete     Labs: BNP (last 3 results) No results for input(s): BNP in the last 8760 hours. Basic  Metabolic Panel: Recent Labs  Lab 01/21/19 0510 01/22/19 0518 01/23/19 0827 01/24/19 0033  NA 138 141 140 139  K 4.9 5.0 4.8 4.5  CL 93* 100 96* 97*  CO2 33* 33* 38* 36*  GLUCOSE 101* 117* 93 103*  BUN 42* 41* 29* 27*  CREATININE 2.35* 1.72* 1.16 1.15  CALCIUM 9.1 8.7* 8.7* 8.3*   Liver Function Tests: No results for input(s): AST, ALT, ALKPHOS, BILITOT, PROT, ALBUMIN in the  last 168 hours. No results for input(s): LIPASE, AMYLASE in the last 168 hours. No results for input(s): AMMONIA in the last 168 hours. CBC: Recent Labs  Lab 01/21/19 0811 01/23/19 0827 01/24/19 0033  WBC 7.2 5.3 6.2  HGB 14.1 13.1 12.0*  HCT 45.0 43.7 39.7  MCV 102.5* 107.1* 104.5*  PLT 158 136* 125*   Cardiac Enzymes: No results for input(s): CKTOTAL, CKMB, CKMBINDEX, TROPONINI in the last 168 hours. BNP: Invalid input(s): POCBNP CBG: Recent Labs  Lab 01/21/19 2034  GLUCAP 131*   D-Dimer No results for input(s): DDIMER in the last 72 hours. Hgb A1c No results for input(s): HGBA1C in the last 72 hours. Lipid Profile No results for input(s): CHOL, HDL, LDLCALC, TRIG, CHOLHDL, LDLDIRECT in the last 72 hours. Thyroid function studies No results for input(s): TSH, T4TOTAL, T3FREE, THYROIDAB in the last 72 hours.  Invalid input(s): FREET3 Anemia work up No results for input(s): VITAMINB12, FOLATE, FERRITIN, TIBC, IRON, RETICCTPCT in the last 72 hours. Urinalysis    Component Value Date/Time   COLORURINE YELLOW 01/21/2019 0433   APPEARANCEUR HAZY (A) 01/21/2019 0433   LABSPEC 1.016 01/21/2019 0433   PHURINE 5.0 01/21/2019 0433   GLUCOSEU NEGATIVE 01/21/2019 0433   HGBUR NEGATIVE 01/21/2019 0433   BILIRUBINUR NEGATIVE 01/21/2019 0433   KETONESUR NEGATIVE 01/21/2019 0433   PROTEINUR 30 (A) 01/21/2019 0433   UROBILINOGEN 1.0 01/27/2008 2233   NITRITE NEGATIVE 01/21/2019 0433   LEUKOCYTESUR MODERATE (A) 01/21/2019 0433   Sepsis Labs Invalid input(s): PROCALCITONIN,  WBC,   LACTICIDVEN Microbiology Recent Results (from the past 240 hour(s))  SARS Coronavirus 2 (CEPHEID - Performed in [facility] hospital lab), Hosp Order     Status: None   Collection Time: 01/21/19  4:58 AM  Result Value Ref Range Status   SARS Coronavirus 2 NEGATIVE NEGATIVE Final    Comment: (NOTE) If result is NEGATIVE SARS-CoV-2 target nucleic acids are NOT DETECTED. The SARS-CoV-2 RNA is generally detectable in upper and lower  respiratory specimens during the acute phase of infection. The lowest  concentration of SARS-CoV-2 viral copies this assay can detect is 250  copies / mL. A negative result does not preclude SARS-CoV-2 infection  and should not be used as the sole basis for treatment or other  patient management decisions.  A negative result may occur with  improper specimen collection / handling, submission of specimen other  than nasopharyngeal swab, presence of viral mutation(s) within the  areas targeted by this assay, and inadequate number of viral copies  (<250 copies / mL). A negative result must be combined with clinical  observations, patient history, and epidemiological information. If result is POSITIVE SARS-CoV-2 target nucleic acids are DETECTED. The SARS-CoV-2 RNA is generally detectable in upper and lower  respiratory specimens dur ing the acute phase of infection.  Positive  results are indicative of active infection with SARS-CoV-2.  Clinical  correlation with patient history and other diagnostic information is  necessary to determine patient infection status.  Positive results do  not rule out bacterial infection or co-infection with other viruses. If result is PRESUMPTIVE POSTIVE SARS-CoV-2 nucleic acids MAY BE PRESENT.   A presumptive positive result was obtained on the submitted specimen  and confirmed on repeat testing.  While 2019 novel coronavirus  (SARS-CoV-2) nucleic acids may be present in the submitted sample  additional confirmatory testing may  be necessary for epidemiological  and / or clinical management purposes  to differentiate between  SARS-CoV-2 and other Sarbecovirus currently  known to infect humans.  If clinically indicated additional testing with an alternate test  methodology [phone]) is advised. The SARS-CoV-2 RNA is generally  detectable in upper and lower respiratory sp ecimens during the acute  phase of infection. The expected result is Negative. Fact Sheet for Patients:  BoilerBrush.com.cyhttps://www.fda.gov/media/136312/download Fact Sheet for Healthcare Providers: https://pope.com/https://www.fda.gov/media/136313/download This test is not yet approved or cleared by the [place] FDA and has been authorized for detection and/or diagnosis of SARS-CoV-2 by FDA under an Emergency Use Authorization (EUA).  This EUA will remain in effect (meaning this test can be used) for the duration of the COVID-19 declaration under Section 564(b)(1) of the Act, 21 U.S.C. section 360bbb-3(b)(1), unless the authorization is terminated or revoked sooner. Performed at [facility], [address] [place], [place] [address]   Culture, blood (routine x 2)     Status: None (Preliminary result)   Collection Time: 01/21/19 11:24 AM  Result Value Ref Range Status   Specimen Description BLOOD LEFT ANTECUBITAL  Final   Special Requests   Final    BOTTLES DRAWN AEROBIC ONLY Blood Culture adequate volume   Culture   Final    NO GROWTH 3 DAYS Performed at [facility], [address] [place], [place] [address]    Report Status PENDING  Incomplete  Culture, blood (routine x 2)     Status: None (Preliminary result)   Collection Time: 01/21/19 11:30 AM  Result Value Ref Range Status   Specimen Description BLOOD LEFT ANTECUBITAL  Final   Special Requests   Final    BOTTLES DRAWN AEROBIC ONLY Blood Culture adequate volume   Culture   Final    NO GROWTH 3 DAYS Performed at [facility], [address] [place], [place] [address]    Report Status  PENDING  Incomplete     Time coordinating discharge:  35 minutes  SIGNED:   Dorcas CarrowKuber Jashley Yellin, MD  [facility] 01/25/2019, 8:01 AM Pager [phone]  If 7PM-7AM, please contact night-coverage [email or web address] Password [email or web address]

## 2019-01-26 LAB — CULTURE, BLOOD (ROUTINE X 2)
Culture: NO GROWTH
Culture: NO GROWTH
Special Requests: ADEQUATE
Special Requests: ADEQUATE

## 2019-01-28 DIAGNOSIS — Z7901 Long term (current) use of anticoagulants: Secondary | ICD-10-CM | POA: Diagnosis not present

## 2019-01-28 DIAGNOSIS — I480 Paroxysmal atrial fibrillation: Secondary | ICD-10-CM | POA: Diagnosis not present

## 2019-01-31 DIAGNOSIS — G4733 Obstructive sleep apnea (adult) (pediatric): Secondary | ICD-10-CM | POA: Diagnosis not present

## 2019-02-02 DIAGNOSIS — I890 Lymphedema, not elsewhere classified: Secondary | ICD-10-CM | POA: Diagnosis not present

## 2019-02-02 DIAGNOSIS — G9341 Metabolic encephalopathy: Secondary | ICD-10-CM | POA: Diagnosis not present

## 2019-02-02 DIAGNOSIS — J9602 Acute respiratory failure with hypercapnia: Secondary | ICD-10-CM | POA: Diagnosis not present

## 2019-02-02 DIAGNOSIS — N179 Acute kidney failure, unspecified: Secondary | ICD-10-CM | POA: Diagnosis not present

## 2019-02-02 DIAGNOSIS — I4891 Unspecified atrial fibrillation: Secondary | ICD-10-CM | POA: Diagnosis not present

## 2019-02-04 DIAGNOSIS — N179 Acute kidney failure, unspecified: Secondary | ICD-10-CM | POA: Diagnosis not present

## 2019-02-04 DIAGNOSIS — Z7901 Long term (current) use of anticoagulants: Secondary | ICD-10-CM | POA: Diagnosis not present

## 2019-02-04 DIAGNOSIS — I480 Paroxysmal atrial fibrillation: Secondary | ICD-10-CM | POA: Diagnosis not present

## 2019-02-07 ENCOUNTER — Ambulatory Visit: Payer: 59 | Admitting: Physician Assistant

## 2019-02-16 DIAGNOSIS — I480 Paroxysmal atrial fibrillation: Secondary | ICD-10-CM | POA: Diagnosis not present

## 2019-02-16 DIAGNOSIS — Z7901 Long term (current) use of anticoagulants: Secondary | ICD-10-CM | POA: Diagnosis not present

## 2019-02-28 DIAGNOSIS — Z7901 Long term (current) use of anticoagulants: Secondary | ICD-10-CM | POA: Diagnosis not present

## 2019-03-12 DIAGNOSIS — I499 Cardiac arrhythmia, unspecified: Secondary | ICD-10-CM | POA: Diagnosis not present

## 2019-03-19 DIAGNOSIS — 419620001 Death: Secondary | SNOMED CT | POA: Diagnosis not present

## 2019-03-19 DEATH — deceased

## 2020-12-02 IMAGING — CT CT HEAD WITHOUT CONTRAST
4 series · 15 of 47 positions shown, 17 images · non-contrast
Comparison: None.

CLINICAL DATA: Unexplained altered level of consciousness. Morbid
obesity.

EXAM:
CT HEAD WITHOUT CONTRAST
TECHNIQUE: Contiguous axial images were obtained from the base of the skull
through the vertex without intravenous contrast.

[Series 3: head wo · axial · 0.44mm/px · z∈[+1246,+1371]mm · 7 of 35 slices shown, 9 images]
[im 5/35  brain]
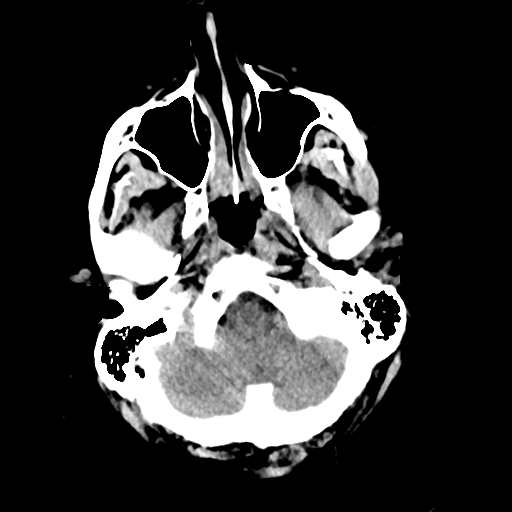
[im 5/35  bone]
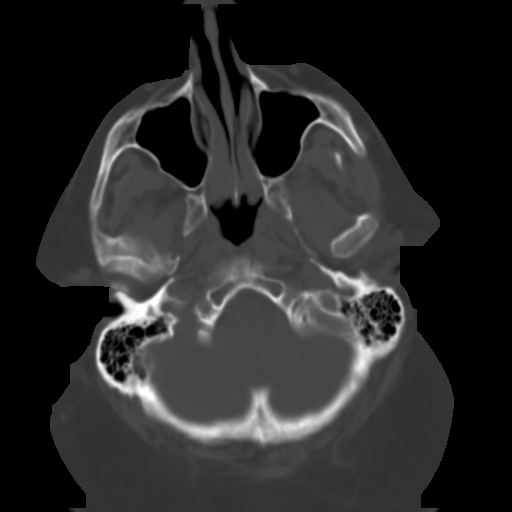
[im 9/35  brain]
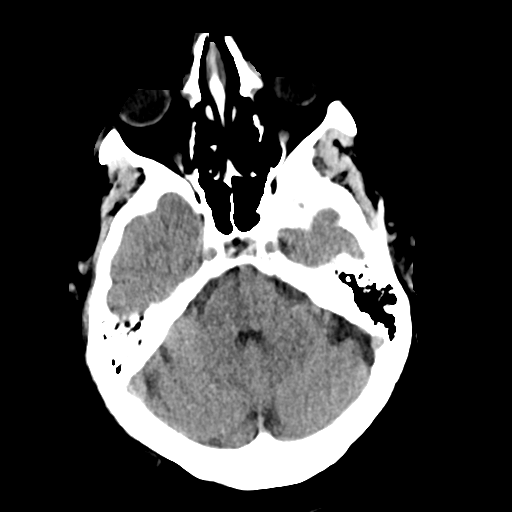
[im 13/35  brain]
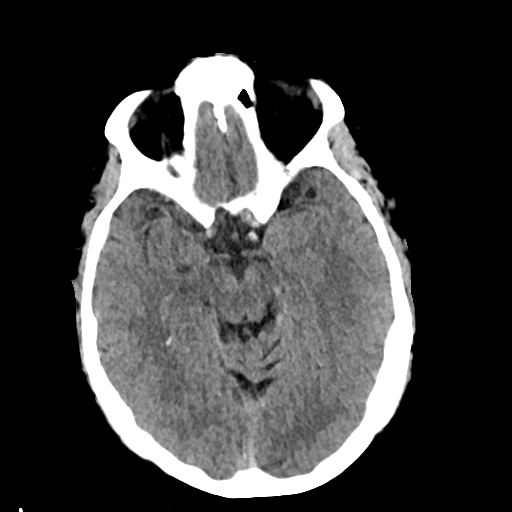
[im 18/35  brain]
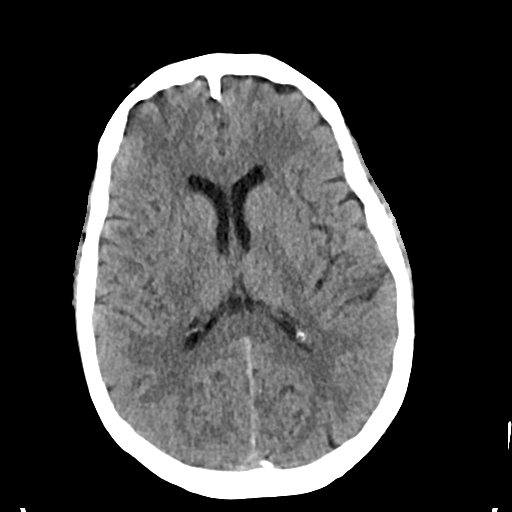
[im 22/35  brain]
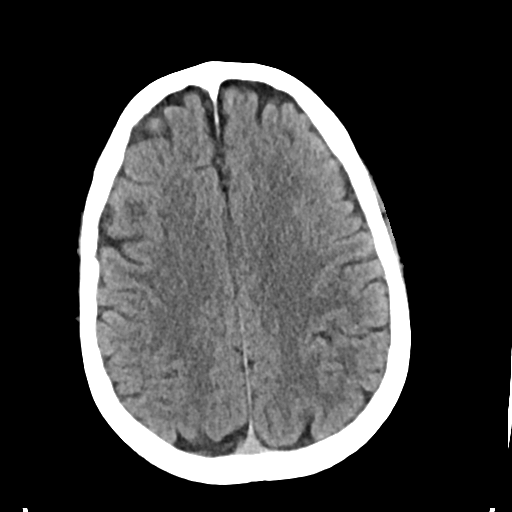
[im 22/35  bone]
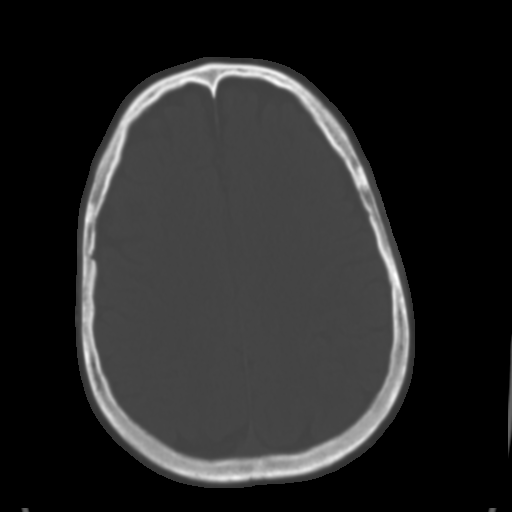
[im 26/35  brain]
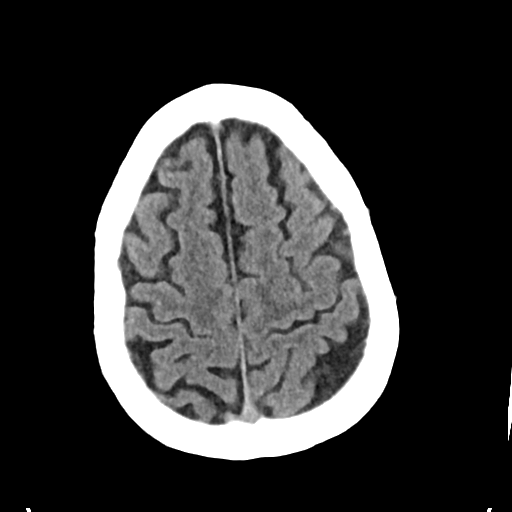
[im 30/35  brain]
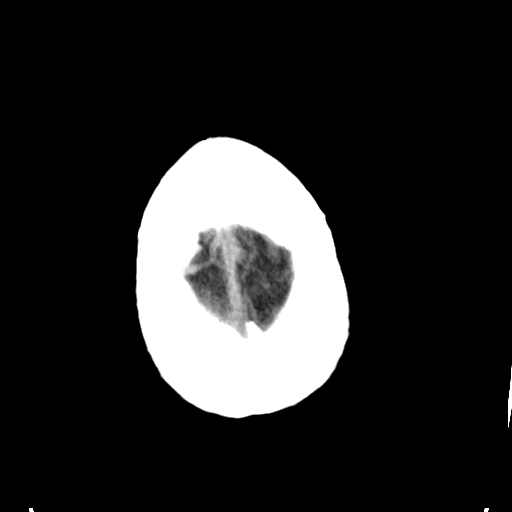

[Series 4: head bone · axial · 0.44mm/px · z∈[+1242,+1260]mm · 2 of 88 slices shown]
[im 9/88  bone]
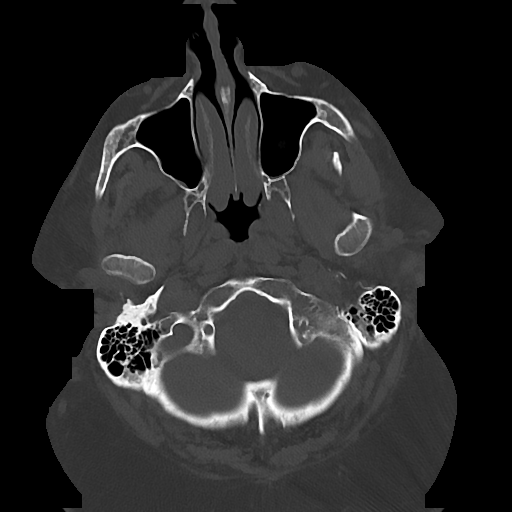
[im 18/88  bone]
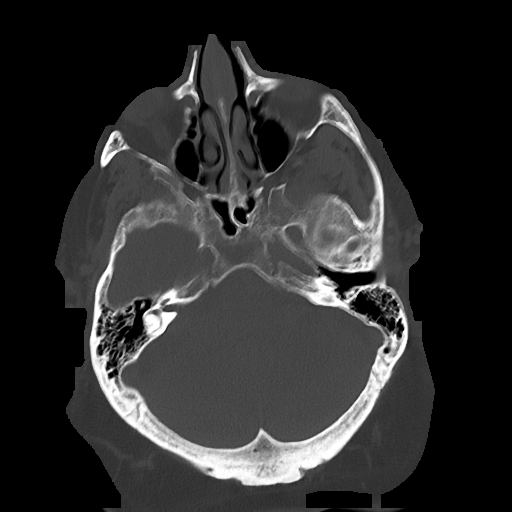

[Series 5: cor soft · coronal · 0.34mm/px · 3 of 84 slices shown]
[im 28/84  brain]
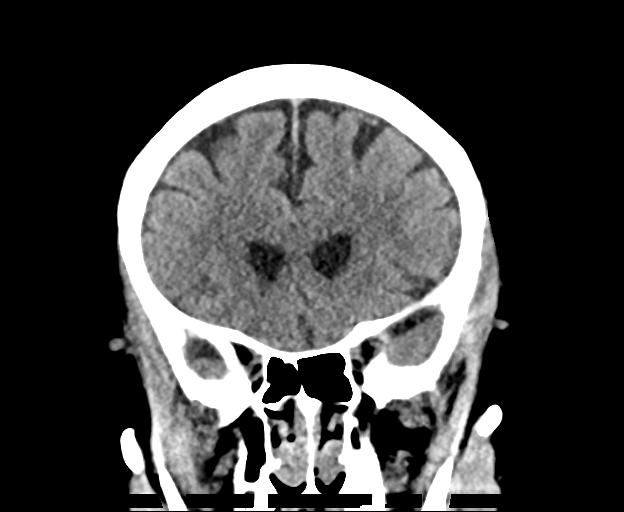
[im 37/84  brain]
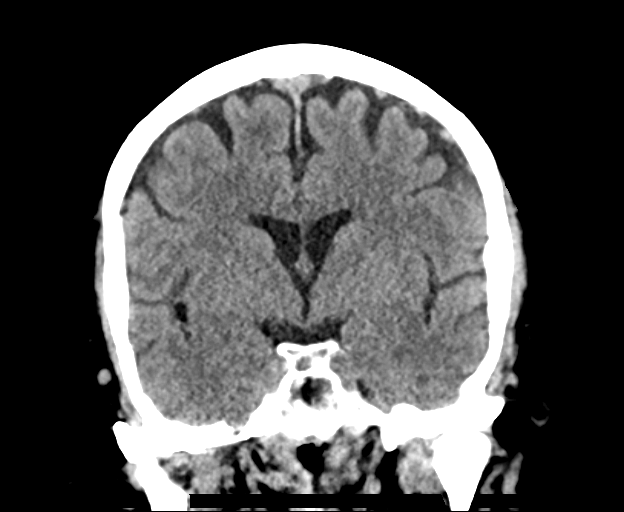
[im 47/84  brain]
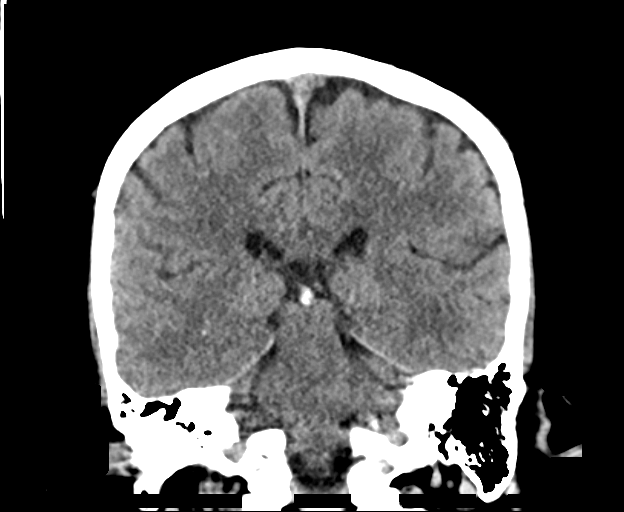

[Series 6: sag soft · sagittal · 0.37mm/px · 3 of 67 slices shown]
[im 23/67  brain]
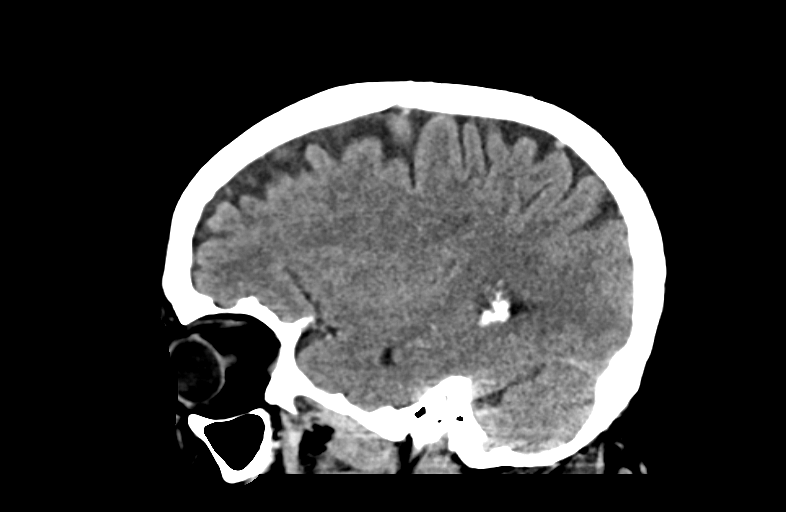
[im 34/67  brain]
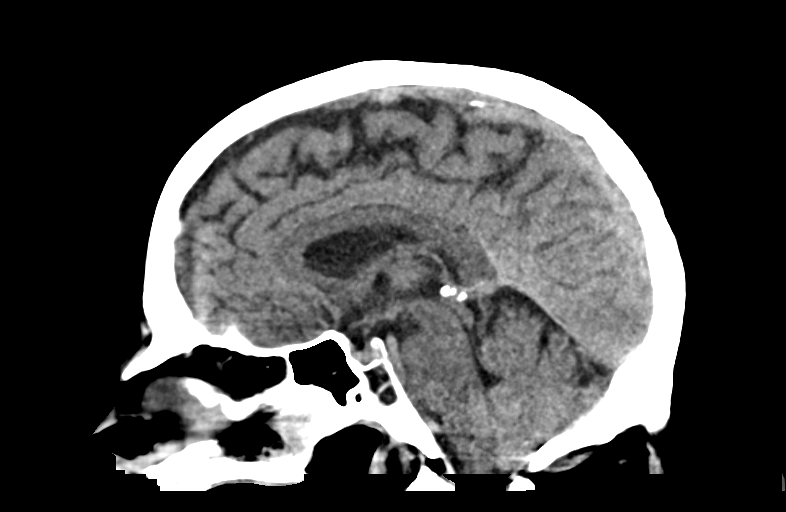
[im 45/67  brain]
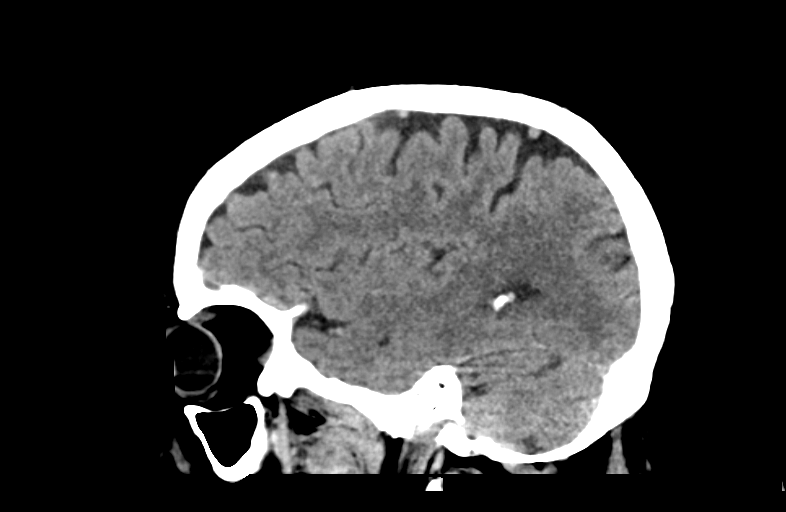

[15 of 47 positions shown; findings below may reference images not displayed]

FINDINGS: Brain: No evidence of acute infarction, hemorrhage, hydrocephalus,
extra-axial collection or mass lesion/mass effect. Normal for age
cerebral volume. No white matter disease.

Vascular: Calcification of the cavernous internal carotid arteries
consistent with cerebrovascular atherosclerotic disease. No signs of
intracranial large vessel occlusion.

Skull: Normal. Negative for fracture or focal lesion.

Sinuses/Orbits: No acute finding. Retention cyst LEFT maxillary
sinus. Unremarkable orbits.

Other: None.
IMPRESSION: Negative exam.

## 2020-12-02 IMAGING — DX PORTABLE CHEST - 1 VIEW
1 series · 1 of 1 positions shown · non-contrast
Comparison: 06/08/2015 and 10/23/2014

CLINICAL DATA: Hypotension.

EXAM:
PORTABLE CHEST 1 VIEW

[chest ap]
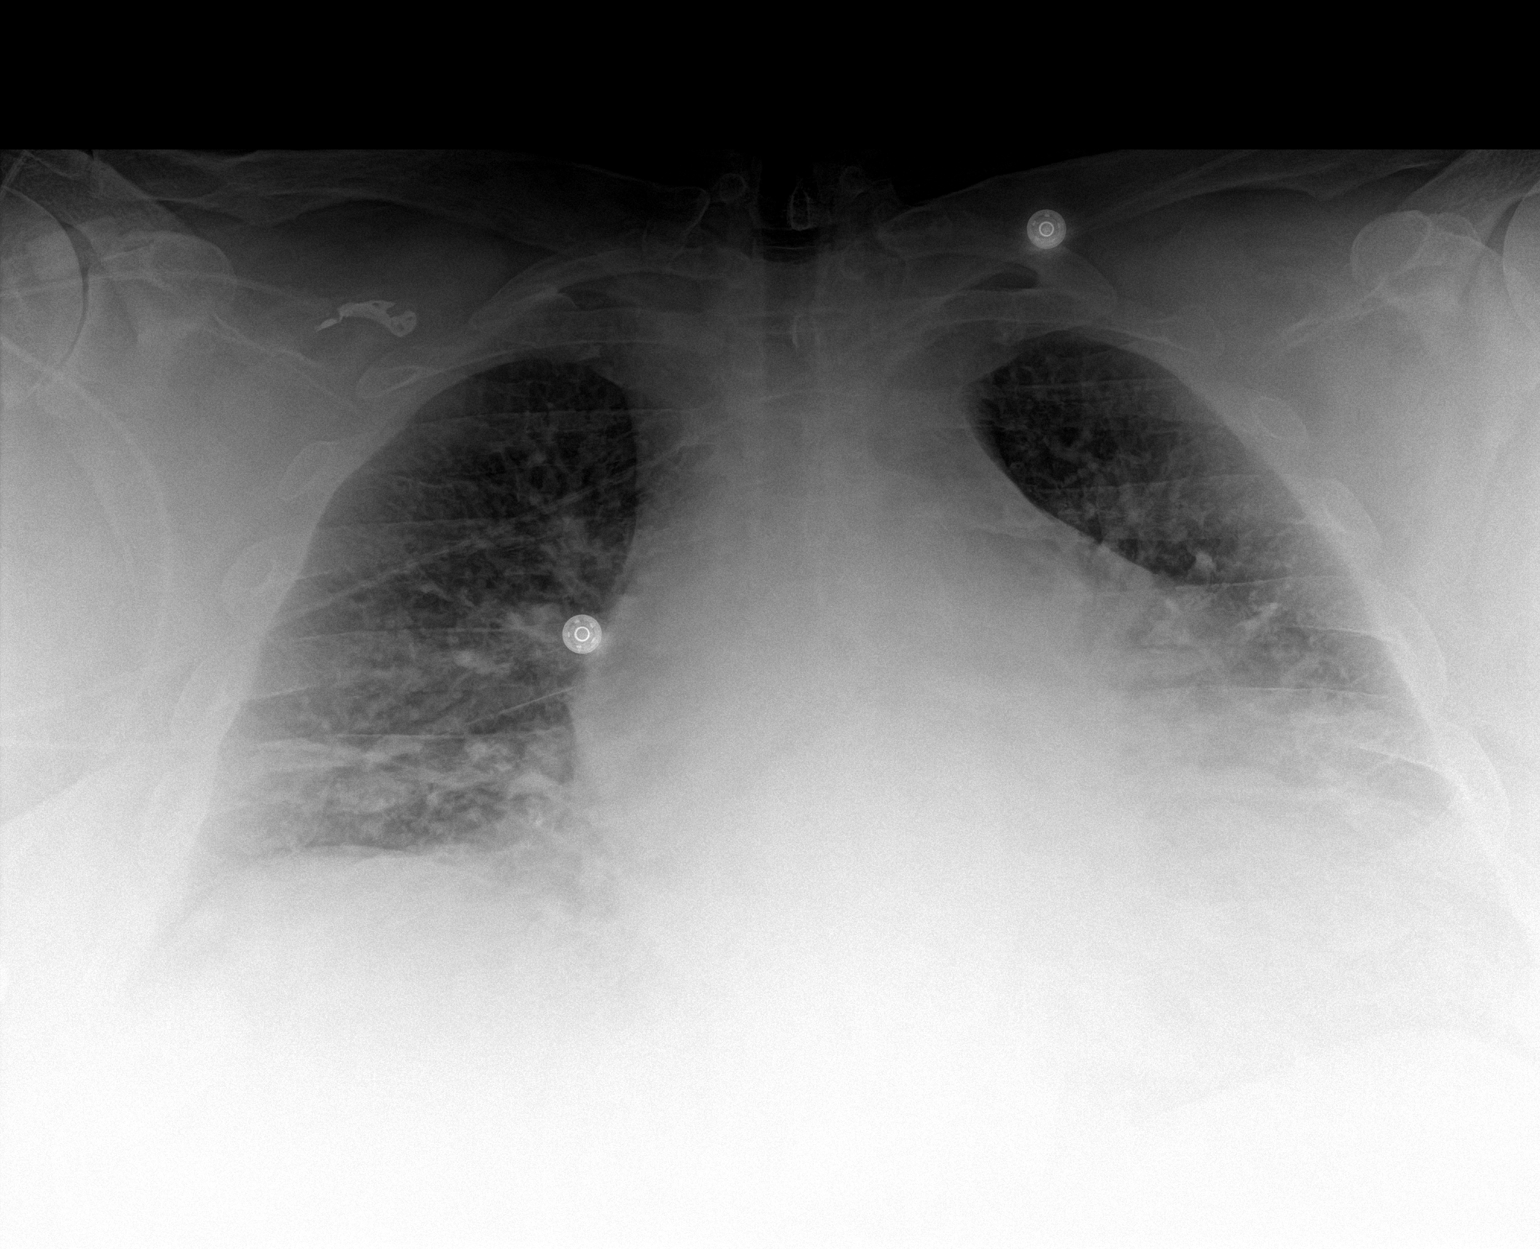

[1 of 1 positions shown; findings below may reference images not displayed]

FINDINGS: Chronic cardiomegaly. Slight pulmonary vascular prominence. No
discrete infiltrates or effusions. No acute bone abnormality.
IMPRESSION: Mild cardiomegaly with slight pulmonary vascular congestion.
